# Patient Record
Sex: Male | Born: 1940 | Race: White | Hispanic: No | Marital: Married
Health system: Southern US, Academic
[De-identification: ages and names within clinical notes are randomized; demographics above are authoritative.]

## PROBLEM LIST (undated history)

## (undated) ENCOUNTER — Encounter

## (undated) ENCOUNTER — Telehealth

## (undated) ENCOUNTER — Ambulatory Visit: Payer: MEDICARE

## (undated) ENCOUNTER — Encounter
Attending: Student in an Organized Health Care Education/Training Program | Primary: Student in an Organized Health Care Education/Training Program

## (undated) ENCOUNTER — Ambulatory Visit: Payer: Medicare (Managed Care)

## (undated) ENCOUNTER — Encounter: Attending: Dermatology | Primary: Dermatology

## (undated) ENCOUNTER — Ambulatory Visit

## (undated) ENCOUNTER — Telehealth: Attending: Dermatology | Primary: Dermatology

## (undated) DIAGNOSIS — L111 Transient acantholytic dermatosis [Grover]: Secondary | ICD-10-CM

## (undated) DIAGNOSIS — E785 Hyperlipidemia, unspecified: Secondary | ICD-10-CM

## (undated) DIAGNOSIS — C801 Malignant (primary) neoplasm, unspecified: Secondary | ICD-10-CM

## (undated) DIAGNOSIS — K2270 Barrett's esophagus without dysplasia: Secondary | ICD-10-CM

## (undated) DIAGNOSIS — I10 Essential (primary) hypertension: Secondary | ICD-10-CM

## (undated) DIAGNOSIS — K219 Gastro-esophageal reflux disease without esophagitis: Secondary | ICD-10-CM

## (undated) DIAGNOSIS — I059 Rheumatic mitral valve disease, unspecified: Secondary | ICD-10-CM

## (undated) DIAGNOSIS — K635 Polyp of colon: Secondary | ICD-10-CM

## (undated) HISTORY — PX: SKIN CANCER EXCISION: SHX779

## (undated) HISTORY — DX: Barrett's esophagus without dysplasia: K22.70

## (undated) HISTORY — DX: Gastro-esophageal reflux disease without esophagitis: K21.9

## (undated) HISTORY — DX: Polyp of colon: K63.5

---

## 2006-12-06 ENCOUNTER — Ambulatory Visit: Payer: Self-pay | Admitting: Ophthalmology

## 2006-12-20 ENCOUNTER — Ambulatory Visit: Payer: Self-pay | Admitting: Ophthalmology

## 2007-06-12 ENCOUNTER — Ambulatory Visit: Payer: Self-pay | Admitting: Family Medicine

## 2009-10-04 ENCOUNTER — Ambulatory Visit: Payer: Self-pay | Admitting: Family Medicine

## 2010-07-02 ENCOUNTER — Ambulatory Visit: Payer: Self-pay | Admitting: Ophthalmology

## 2010-07-23 ENCOUNTER — Ambulatory Visit: Payer: Self-pay | Admitting: Ophthalmology

## 2011-09-19 ENCOUNTER — Ambulatory Visit: Payer: Self-pay | Admitting: Family Medicine

## 2012-08-12 ENCOUNTER — Ambulatory Visit: Payer: Self-pay | Admitting: Emergency Medicine

## 2012-08-12 LAB — URINALYSIS, COMPLETE
Glucose,UR: NEGATIVE mg/dL (ref 0–75)
Ketone: NEGATIVE
Nitrite: NEGATIVE
Ph: 6 (ref 4.5–8.0)
Protein: NEGATIVE

## 2012-08-13 LAB — URINE CULTURE

## 2014-12-07 ENCOUNTER — Other Ambulatory Visit: Payer: Self-pay | Admitting: Cardiology

## 2014-12-07 DIAGNOSIS — R1011 Right upper quadrant pain: Secondary | ICD-10-CM

## 2014-12-11 ENCOUNTER — Ambulatory Visit: Admission: RE | Admit: 2014-12-11 | Payer: Medicare PPO | Source: Ambulatory Visit

## 2014-12-11 ENCOUNTER — Ambulatory Visit
Admission: RE | Admit: 2014-12-11 | Discharge: 2014-12-11 | Disposition: A | Discharge status: Home / Self Care | Payer: Medicare PPO | Source: Ambulatory Visit | Attending: Cardiology | Admitting: Cardiology

## 2014-12-11 ENCOUNTER — Ambulatory Visit: Payer: Medicare PPO

## 2014-12-11 DIAGNOSIS — R1011 Right upper quadrant pain: Secondary | ICD-10-CM | POA: Insufficient documentation

## 2014-12-11 DIAGNOSIS — R110 Nausea: Secondary | ICD-10-CM | POA: Diagnosis not present

## 2015-04-23 DIAGNOSIS — K635 Polyp of colon: Secondary | ICD-10-CM

## 2015-04-23 HISTORY — DX: Polyp of colon: K63.5

## 2015-05-09 ENCOUNTER — Encounter: Payer: Self-pay | Admitting: *Deleted

## 2015-05-10 ENCOUNTER — Ambulatory Visit: Payer: Medicare Other | Admitting: Anesthesiology

## 2015-05-10 ENCOUNTER — Encounter: Payer: Self-pay | Admitting: *Deleted

## 2015-05-10 ENCOUNTER — Encounter
Admission: RE | Disposition: A | Discharge status: Home / Self Care | Payer: Self-pay | Source: Ambulatory Visit | Attending: Gastroenterology

## 2015-05-10 ENCOUNTER — Ambulatory Visit
Admission: RE | Admit: 2015-05-10 | Discharge: 2015-05-10 | Disposition: A | Discharge status: Home / Self Care | Payer: Medicare Other | Source: Ambulatory Visit | Attending: Gastroenterology | Admitting: Gastroenterology

## 2015-05-10 DIAGNOSIS — E785 Hyperlipidemia, unspecified: Secondary | ICD-10-CM | POA: Diagnosis not present

## 2015-05-10 DIAGNOSIS — R1013 Epigastric pain: Secondary | ICD-10-CM | POA: Diagnosis present

## 2015-05-10 DIAGNOSIS — I341 Nonrheumatic mitral (valve) prolapse: Secondary | ICD-10-CM | POA: Insufficient documentation

## 2015-05-10 DIAGNOSIS — Z85828 Personal history of other malignant neoplasm of skin: Secondary | ICD-10-CM | POA: Insufficient documentation

## 2015-05-10 DIAGNOSIS — D131 Benign neoplasm of stomach: Secondary | ICD-10-CM | POA: Diagnosis not present

## 2015-05-10 DIAGNOSIS — Z882 Allergy status to sulfonamides status: Secondary | ICD-10-CM | POA: Insufficient documentation

## 2015-05-10 DIAGNOSIS — D127 Benign neoplasm of rectosigmoid junction: Secondary | ICD-10-CM | POA: Diagnosis not present

## 2015-05-10 DIAGNOSIS — I10 Essential (primary) hypertension: Secondary | ICD-10-CM | POA: Insufficient documentation

## 2015-05-10 HISTORY — DX: Essential (primary) hypertension: I10

## 2015-05-10 HISTORY — DX: Rheumatic mitral valve disease, unspecified: I05.9

## 2015-05-10 HISTORY — PX: ESOPHAGOGASTRODUODENOSCOPY (EGD) WITH PROPOFOL: SHX5813

## 2015-05-10 HISTORY — DX: Malignant (primary) neoplasm, unspecified: C80.1

## 2015-05-10 HISTORY — DX: Hyperlipidemia, unspecified: E78.5

## 2015-05-10 HISTORY — PX: COLONOSCOPY WITH PROPOFOL: SHX5780

## 2015-05-10 SURGERY — COLONOSCOPY WITH PROPOFOL
Anesthesia: General

## 2015-05-10 MED ORDER — LIDOCAINE HCL (CARDIAC) 20 MG/ML IV SOLN
INTRAVENOUS | Status: DC | PRN
Start: 2015-05-10 — End: 2015-05-10
  Administered 2015-05-10: 80 mg via INTRAVENOUS

## 2015-05-10 MED ORDER — SODIUM CHLORIDE 0.9 % IV SOLN
INTRAVENOUS | Status: DC
Start: 2015-05-10 — End: 2015-05-10

## 2015-05-10 MED ORDER — MIDAZOLAM HCL 2 MG/2ML IJ SOLN
INTRAMUSCULAR | Status: DC | PRN
  Administered 2015-05-10: 2 mg via INTRAVENOUS

## 2015-05-10 MED ORDER — PROPOFOL 500 MG/50ML IV EMUL
INTRAVENOUS | Status: DC | PRN
Start: 2015-05-10 — End: 2015-05-10
  Administered 2015-05-10: 150 ug/kg/min via INTRAVENOUS

## 2015-05-10 MED ORDER — SODIUM CHLORIDE 0.9 % IV SOLN
INTRAVENOUS | Status: DC
  Administered 2015-05-10: 10:00:00 via INTRAVENOUS

## 2015-05-10 NOTE — Op Note (Signed)
[facility] Patient Name: Steve Rhodes Procedure Date: 05/10/2015 11:06 AM MRN: [id number] Account #: [id number] Date of Birth: Mar 30, 1941 Admit Type: Outpatient Age: 74 Room: [facility] ENDO ROOM 4 Gender: Male Note Status: Finalized Procedure:         Upper GI endoscopy Indications:       Epigastric abdominal pain Providers:         Lupita Dawn. Candace Cruise, MD Referring MD:      Amado Nash, MD (Referring MD) Medicines:         Monitored Anesthesia Care Complications:     No immediate complications. Procedure:         Pre-Anesthesia Assessment:                    - Prior to the procedure, a History and Physical was                     performed, and patient medications, allergies and                     sensitivities were reviewed. The patient's tolerance of                     previous anesthesia was reviewed.                    - The risks and benefits of the procedure and the sedation                     options and risks were discussed with the patient. All                     questions were answered and informed consent was obtained.                    - After reviewing the risks and benefits, the patient was                     deemed in satisfactory condition to undergo the procedure.                    After obtaining informed consent, the endoscope was passed                     under direct vision. Throughout the procedure, the                     patient's blood pressure, pulse, and oxygen saturations                     were monitored continuously. The Endoscope was introduced                     through the mouth, and advanced to the second part of                     duodenum. The upper GI endoscopy was accomplished without                     difficulty. The patient tolerated the procedure well. Findings:      A single polyp was found at the gastroesophageal junction. Biopsies were       taken with a cold forceps for histology.      The exam  was otherwise  without abnormality.      The entire examined stomach was normal.      The examined duodenum was normal. 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 Biopsied.                    - The examination was otherwise normal.                    - Normal stomach.                    - Normal examined duodenum. Recommendation:    - Discharge patient to home.                    - Observe patient's clinical course.                    - Await pathology results.                    - The findings and recommendations were discussed with the                     patient. Procedure Code(s): --- Professional ---                    [phone], Esophagogastroduodenoscopy, flexible, transoral;                     with biopsy, single or multiple Diagnosis Code(s): --- Professional ---                    K31.7, Polyp of stomach and duodenum                    R10.13, Epigastric pain CPT copyright 2014 American Medical Association. All rights reserved. The codes documented in this report are preliminary and upon coder review may  be revised to meet current compliance requirements. Hulen Luster, MD 05/10/2015 11:18:48 AM This report has been signed electronically. Number of Addenda: 0 Note Initiated On: 05/10/2015 11:06 AM      [facility]

## 2015-05-10 NOTE — Anesthesia Postprocedure Evaluation (Signed)
  Anesthesia Post-op Note  Patient: Steve Rhodes.  Procedure(s) Performed: Procedure(s): COLONOSCOPY WITH PROPOFOL (N/A) ESOPHAGOGASTRODUODENOSCOPY (EGD) WITH PROPOFOL (N/A)  Anesthesia type:General  Patient location: PACU  Post pain: Pain level controlled  Post assessment: Post-op Vital signs reviewed, Patient's Cardiovascular Status Stable, Respiratory Function Stable, Patent Airway and No signs of Nausea or vomiting  Post vital signs: Reviewed and stable  Last Vitals:  Filed Vitals:   05/10/15 1211  BP: 127/70  Pulse: 58  Temp:   Resp: 22    Level of consciousness: awake, alert  and patient cooperative  Complications: No apparent anesthesia complications

## 2015-05-10 NOTE — H&P (Signed)
  Date of Initial H&P: 04/11/2015  History reviewed, patient examined, no change in status, stable for surgery.

## 2015-05-10 NOTE — Anesthesia Preprocedure Evaluation (Addendum)
Anesthesia Evaluation  Patient identified by MRN, date of birth, ID band Patient awake    Reviewed: Allergy & Precautions, H&P , NPO status , Patient's Chart, lab work & pertinent test results  History of Anesthesia Complications Negative for: history of anesthetic complications  Airway Mallampati: III  TM Distance: >3 FB Neck ROM: limited    Dental  (+) Poor Dentition, Chipped   Pulmonary neg pulmonary ROS, neg shortness of breath,    Pulmonary exam normal breath sounds clear to auscultation       Cardiovascular Exercise Tolerance: Good hypertension, (-) Past MI Normal cardiovascular exam+ Valvular Problems/Murmurs (MR)  Rhythm:regular Rate:Normal     Neuro/Psych negative neurological ROS  negative psych ROS   GI/Hepatic negative GI ROS, Neg liver ROS,   Endo/Other  negative endocrine ROS  Renal/GU negative Renal ROS  negative genitourinary   Musculoskeletal   Abdominal   Peds  Hematology negative hematology ROS (+)   Anesthesia Other Findings Past Medical History:   Hyperlipidemia                                               Hypertension                                                 Cancer ([place])                                                   Comment:skin cancer   Mitral valve disorder                                       Past Surgical History:   SKIN CANCER EXCISION                                            Comment:on arms  BMI    Body Mass Index   32.99 kg/m 2    Patient reports cardiac clearance from this procedure    Reproductive/Obstetrics negative OB ROS                            Anesthesia Physical Anesthesia Plan  ASA: III  Anesthesia Plan: General   Post-op Pain Management:    Induction:   Airway Management Planned:   Additional Equipment:   Intra-op Plan:   Post-operative Plan:   Informed Consent: I have reviewed the patients History and  Physical, chart, labs and discussed the procedure including the risks, benefits and alternatives for the proposed anesthesia with the patient or authorized representative who has indicated his/her understanding and acceptance.   Dental Advisory Given  Plan Discussed with: Anesthesiologist, CRNA and Surgeon  Anesthesia Plan Comments:         Anesthesia Quick Evaluation

## 2015-05-10 NOTE — Op Note (Signed)
[facility] Patient Name: Steve Rhodes Procedure Date: 05/10/2015 11:05 AM MRN: [id number] Account #: [id number] Date of Birth: February 28, 1941 Admit Type: Outpatient Age: 74 Room: [facility] ENDO ROOM 4 Gender: Male Note Status: Finalized Procedure:         Colonoscopy Indications:       Change in bowel habits Providers:         Lupita Dawn. Candace Cruise, MD Referring MD:      Amado Nash, MD (Referring MD) Medicines:         Monitored Anesthesia Care Complications:     No immediate complications. Procedure:         Pre-Anesthesia Assessment:                    - Prior to the procedure, a History and Physical was                     performed, and patient medications, allergies and                     sensitivities were reviewed. The patient's tolerance of                     previous anesthesia was reviewed.                    - The risks and benefits of the procedure and the sedation                     options and risks were discussed with the patient. All                     questions were answered and informed consent was obtained.                    - After reviewing the risks and benefits, the patient was                     deemed in satisfactory condition to undergo the procedure.                    After obtaining informed consent, the colonoscope was                     passed under direct vision. Throughout the procedure, the                     patient's blood pressure, pulse, and oxygen saturations                     were monitored continuously. The Colonoscope was                     introduced through the anus and advanced to the the cecum,                     identified by appendiceal orifice and ileocecal valve. The                     colonoscopy was performed without difficulty. The patient                     tolerated the procedure well. The quality of the bowel  preparation was fair. Findings:      A diminutive polyp was  found in the recto-sigmoid colon. The polyp was       sessile. The polyp was removed with a jumbo cold forceps. Resection and       retrieval were complete.      The exam was otherwise without abnormality. Impression:        - One diminutive polyp at the recto-sigmoid colon.                     Resected and retrieved.                    - The examination was otherwise normal. Recommendation:    - Discharge patient to home.                    - Await pathology results.                    - Repeat colonoscopy in 5-10 years for surveillance based                     on pathology results.                    - The findings and recommendations were discussed with the                     patient. Procedure Code(s): --- Professional ---                    [phone], Colonoscopy, flexible; with biopsy, single or                     multiple Diagnosis Code(s): --- Professional ---                    D12.7, Benign neoplasm of rectosigmoid junction                    R19.4, Change in bowel habit CPT copyright 2014 American Medical Association. All rights reserved. The codes documented in this report are preliminary and upon coder review may  be revised to meet current compliance requirements. Hulen Luster, MD 05/10/2015 11:35:51 AM This report has been signed electronically. Number of Addenda: 0 Note Initiated On: 05/10/2015 11:05 AM Scope Withdrawal Time: 0 hours 8 minutes 29 seconds  Total Procedure Duration: 0 hours 10 minutes 42 seconds       [facility]

## 2015-05-10 NOTE — Transfer of Care (Signed)
Immediate Anesthesia Transfer of Care Note  Patient: Steve Rhodes.  Procedure(s) Performed: Procedure(s): COLONOSCOPY WITH PROPOFOL (N/A) ESOPHAGOGASTRODUODENOSCOPY (EGD) WITH PROPOFOL (N/A)  Patient Location: PACU and Endoscopy Unit  Anesthesia Type:General  Level of Consciousness: sedated  Airway & Oxygen Therapy: Patient Spontanous Breathing and Patient connected to nasal cannula oxygen  Post-op Assessment: Report given to RN and Post -op Vital signs reviewed and stable  Post vital signs: Reviewed and stable  Last Vitals: 101/70 bp 18 resp 95% sat 66 hr at 11:30  Filed Vitals:   05/10/15 1130  BP:   Pulse:   Temp: 36.1 C  Resp:     Complications: No apparent anesthesia complications

## 2015-05-13 LAB — SURGICAL PATHOLOGY

## 2015-05-23 DIAGNOSIS — K2270 Barrett's esophagus without dysplasia: Secondary | ICD-10-CM

## 2015-05-23 HISTORY — DX: Barrett's esophagus without dysplasia: K22.70

## 2015-06-10 ENCOUNTER — Ambulatory Visit: Payer: Medicare Other | Admitting: Anesthesiology

## 2015-06-10 ENCOUNTER — Encounter
Admission: RE | Disposition: A | Discharge status: Home / Self Care | Payer: Self-pay | Source: Ambulatory Visit | Attending: Gastroenterology

## 2015-06-10 ENCOUNTER — Ambulatory Visit
Admission: RE | Admit: 2015-06-10 | Discharge: 2015-06-10 | Disposition: A | Discharge status: Home / Self Care | Payer: Medicare Other | Source: Ambulatory Visit | Attending: Gastroenterology | Admitting: Gastroenterology

## 2015-06-10 ENCOUNTER — Encounter: Payer: Self-pay | Admitting: Anesthesiology

## 2015-06-10 DIAGNOSIS — Z79899 Other long term (current) drug therapy: Secondary | ICD-10-CM | POA: Insufficient documentation

## 2015-06-10 DIAGNOSIS — I10 Essential (primary) hypertension: Secondary | ICD-10-CM | POA: Diagnosis not present

## 2015-06-10 DIAGNOSIS — Z8719 Personal history of other diseases of the digestive system: Secondary | ICD-10-CM | POA: Diagnosis not present

## 2015-06-10 DIAGNOSIS — Z85828 Personal history of other malignant neoplasm of skin: Secondary | ICD-10-CM | POA: Insufficient documentation

## 2015-06-10 DIAGNOSIS — Z85820 Personal history of malignant melanoma of skin: Secondary | ICD-10-CM | POA: Diagnosis not present

## 2015-06-10 DIAGNOSIS — R0789 Other chest pain: Secondary | ICD-10-CM | POA: Insufficient documentation

## 2015-06-10 DIAGNOSIS — K219 Gastro-esophageal reflux disease without esophagitis: Secondary | ICD-10-CM | POA: Insufficient documentation

## 2015-06-10 DIAGNOSIS — K2270 Barrett's esophagus without dysplasia: Secondary | ICD-10-CM | POA: Insufficient documentation

## 2015-06-10 DIAGNOSIS — E785 Hyperlipidemia, unspecified: Secondary | ICD-10-CM | POA: Diagnosis not present

## 2015-06-10 HISTORY — PX: ESOPHAGOGASTRODUODENOSCOPY (EGD) WITH PROPOFOL: SHX5813

## 2015-06-10 SURGERY — ESOPHAGOGASTRODUODENOSCOPY (EGD) WITH PROPOFOL
Anesthesia: General

## 2015-06-10 MED ORDER — PHENYLEPHRINE HCL 10 MG/ML IJ SOLN
INTRAMUSCULAR | Status: DC | PRN
  Administered 2015-06-10 (×2): 100 ug via INTRAVENOUS

## 2015-06-10 MED ORDER — PROPOFOL 10 MG/ML IV BOLUS
INTRAVENOUS | Status: DC | PRN
  Administered 2015-06-10: 20 mg via INTRAVENOUS
  Administered 2015-06-10: 30 mg via INTRAVENOUS
  Administered 2015-06-10 (×2): 20 mg via INTRAVENOUS

## 2015-06-10 MED ORDER — SODIUM CHLORIDE 0.9 % IV SOLN: INTRAVENOUS | Status: DC

## 2015-06-10 MED ORDER — SODIUM CHLORIDE 0.9 % IV SOLN
INTRAVENOUS | Status: DC
  Administered 2015-06-10: 1000 mL via INTRAVENOUS

## 2015-06-10 NOTE — Op Note (Signed)
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 Candace Cruise, MD Referring MD:      Marcellina Millin, MD (Referring MD) Medicines:         Monitored Anesthesia Care Complications:     No immediate complications. Procedure:         Pre-Anesthesia Assessment:                    - Prior to the procedure, a History and Physical was                     performed, and patient medications, allergies and                     sensitivities were reviewed. The patient's tolerance of                     previous anesthesia was reviewed.                    - The risks and benefits of the procedure and the sedation                     options and risks were discussed with the patient. All                     questions were answered and informed consent was obtained.                    - After reviewing the risks and benefits, the patient was                     deemed in satisfactory condition to undergo the procedure.                    After obtaining informed consent, the endoscope was passed                     under direct vision. Throughout the procedure, the                     patient's blood pressure, pulse, and oxygen saturations                     were monitored continuously. The Endoscope was introduced                     through the mouth, and advanced to the second part of                     duodenum. The upper GI endoscopy was accomplished without                     difficulty. The patient tolerated the procedure well. Findings:      There were esophageal mucosal changes suspicious for short-segment       Barrett's esophagus present at the  gastroesophageal junction. Mucosa was  biopsied with a cold forceps for histology in a targeted manner at the       gastroesophageal junction. One specimen bottle was sent to pathology.       Nop obvious polyp seen this time. Nevertheless, suspicious area was       removed with polypectomy with hot snare.      The exam was otherwise without abnormality.      The entire examined stomach was normal.      The examined duodenum was normal. Impression:        - Esophageal mucosal changes suspicious for short-segment                     Barrett's esophagus. Biopsied.                    - The examination was otherwise normal.                    - Normal stomach.                    - Normal examined duodenum. Recommendation:    - Discharge patient to home.                    - Observe patient's clinical course.                    - Await pathology results.                    - The findings and recommendations were discussed with the                     patient. Procedure Code(s): --- Professional ---                    [id number], Esophagogastroduodenoscopy, flexible, transoral;                     with biopsy, single or multiple Diagnosis Code(s): --- Professional ---                    K22.8, Other specified diseases of esophagus CPT copyright 2014 American Medical Association. All rights reserved. The codes documented in this report are preliminary and upon coder review may  be revised to meet current compliance requirements. Hulen Luster, MD 06/10/2015 11:14:28 AM This report has been signed electronically. Number of Addenda: 0 Note Initiated On: 06/10/2015 10:53 AM      [facility]

## 2015-06-10 NOTE — H&P (Signed)
  Date of Initial H&P: 05/30/2015  History reviewed, patient examined, no change in status, stable for surgery. 

## 2015-06-10 NOTE — Anesthesia Preprocedure Evaluation (Addendum)
Anesthesia Evaluation  Patient identified by MRN, date of birth, ID band Patient awake    Reviewed: Allergy & Precautions, H&P , NPO status , Patient's Chart, lab work & pertinent test results  History of Anesthesia Complications Negative for: history of anesthetic complications  Airway Mallampati: III  TM Distance: >3 FB Neck ROM: limited    Dental  (+) Poor Dentition, Chipped   Pulmonary neg pulmonary ROS, neg shortness of breath,    Pulmonary exam normal breath sounds clear to auscultation       Cardiovascular Exercise Tolerance: Good hypertension, (-) Past MI Normal cardiovascular exam+ Valvular Problems/Murmurs (MR) MR  Rhythm:regular Rate:Normal     Neuro/Psych negative neurological ROS  negative psych ROS   GI/Hepatic negative GI ROS, Neg liver ROS,   Endo/Other  negative endocrine ROS  Renal/GU negative Renal ROS  negative genitourinary   Musculoskeletal   Abdominal   Peds  Hematology negative hematology ROS (+)   Anesthesia Other Findings Past Medical History:   Hyperlipidemia                                               Hypertension                                                 Cancer ([place])                                                   Comment:skin cancer   Mitral valve disorder                                       Past Surgical History:   SKIN CANCER EXCISION                                            Comment:on arms  BMI    Body Mass Index   32.99 kg/m 2    Patient has cardiac clearance for this procedure.     Reproductive/Obstetrics negative OB ROS                            Anesthesia Physical  Anesthesia Plan  ASA: III  Anesthesia Plan: General   Post-op Pain Management:    Induction:   Airway Management Planned:   Additional Equipment:   Intra-op Plan:   Post-operative Plan:   Informed Consent: I have reviewed the patients History and  Physical, chart, labs and discussed the procedure including the risks, benefits and alternatives for the proposed anesthesia with the patient or authorized representative who has indicated his/her understanding and acceptance.   Dental Advisory Given  Plan Discussed with: Anesthesiologist, CRNA and Surgeon  Anesthesia Plan Comments:         Anesthesia Quick Evaluation

## 2015-06-10 NOTE — Anesthesia Postprocedure Evaluation (Signed)
Anesthesia Post Note  Patient: Ramond Dial.  Procedure(s) Performed: Procedure(s) (LRB): ESOPHAGOGASTRODUODENOSCOPY (EGD) WITH PROPOFOL (N/A)  Patient location during evaluation: Endoscopy Anesthesia Type: General Level of consciousness: awake and alert Pain management: pain level controlled Vital Signs Assessment: post-procedure vital signs reviewed and stable Respiratory status: spontaneous breathing, nonlabored ventilation, respiratory function stable and patient connected to nasal cannula oxygen Cardiovascular status: blood pressure returned to baseline and stable Postop Assessment: no signs of nausea or vomiting Anesthetic complications: no    Last Vitals:  Filed Vitals:   06/10/15 1140 06/10/15 1150  BP: 116/67 127/67  Pulse: 58 59  Temp:    Resp: 19 12    Last Pain: There were no vitals filed for this visit.               Precious Haws [place]

## 2015-06-10 NOTE — Transfer of Care (Signed)
Immediate Anesthesia Transfer of Care Note  Patient: Steve Rhodes.  Procedure(s) Performed: Procedure(s): ESOPHAGOGASTRODUODENOSCOPY (EGD) WITH PROPOFOL (N/A)  Patient Location: Endoscopy Unit  Anesthesia Type:General  Level of Consciousness: awake and alert   Airway & Oxygen Therapy: Patient Spontanous Breathing and Patient connected to nasal cannula oxygen  Post-op Assessment: Report given to RN and Post -op Vital signs reviewed and stable  Post vital signs: Reviewed  Last Vitals:  Filed Vitals:   06/10/15 1118 06/10/15 1130  BP: 90/52 102/56  Pulse: 60 59  Temp: 35.8 C   Resp: 20 19    Complications: No apparent anesthesia complications

## 2015-06-11 LAB — SURGICAL PATHOLOGY

## 2015-06-13 ENCOUNTER — Encounter: Payer: Self-pay | Admitting: Gastroenterology

## 2016-01-28 ENCOUNTER — Ambulatory Visit
Admission: RE | Admit: 2016-01-28 | Discharge: 2016-01-28 | Disposition: A | Discharge status: Home / Self Care | Payer: Medicare Other | Source: Ambulatory Visit | Attending: Cardiology | Admitting: Cardiology

## 2016-01-28 ENCOUNTER — Encounter
Admission: RE | Disposition: A | Discharge status: Home / Self Care | Payer: Self-pay | Source: Ambulatory Visit | Attending: Cardiology

## 2016-01-28 ENCOUNTER — Other Ambulatory Visit: Payer: Self-pay | Admitting: Cardiology

## 2016-01-28 DIAGNOSIS — Z882 Allergy status to sulfonamides status: Secondary | ICD-10-CM | POA: Diagnosis not present

## 2016-01-28 DIAGNOSIS — E782 Mixed hyperlipidemia: Secondary | ICD-10-CM | POA: Insufficient documentation

## 2016-01-28 DIAGNOSIS — Z9889 Other specified postprocedural states: Secondary | ICD-10-CM | POA: Insufficient documentation

## 2016-01-28 DIAGNOSIS — Z8719 Personal history of other diseases of the digestive system: Secondary | ICD-10-CM | POA: Insufficient documentation

## 2016-01-28 DIAGNOSIS — K2270 Barrett's esophagus without dysplasia: Secondary | ICD-10-CM | POA: Diagnosis not present

## 2016-01-28 DIAGNOSIS — Z79899 Other long term (current) drug therapy: Secondary | ICD-10-CM | POA: Diagnosis not present

## 2016-01-28 DIAGNOSIS — Z86010 Personal history of colonic polyps: Secondary | ICD-10-CM | POA: Diagnosis not present

## 2016-01-28 DIAGNOSIS — R0789 Other chest pain: Secondary | ICD-10-CM | POA: Insufficient documentation

## 2016-01-28 DIAGNOSIS — Z91040 Latex allergy status: Secondary | ICD-10-CM | POA: Insufficient documentation

## 2016-01-28 DIAGNOSIS — Z85828 Personal history of other malignant neoplasm of skin: Secondary | ICD-10-CM | POA: Diagnosis not present

## 2016-01-28 DIAGNOSIS — I10 Essential (primary) hypertension: Secondary | ICD-10-CM | POA: Diagnosis not present

## 2016-01-28 DIAGNOSIS — Z8249 Family history of ischemic heart disease and other diseases of the circulatory system: Secondary | ICD-10-CM | POA: Diagnosis not present

## 2016-01-28 DIAGNOSIS — R9430 Abnormal result of cardiovascular function study, unspecified: Secondary | ICD-10-CM | POA: Diagnosis present

## 2016-01-28 HISTORY — PX: CARDIAC CATHETERIZATION: SHX172

## 2016-01-28 SURGERY — LEFT HEART CATH AND CORONARY ANGIOGRAPHY
Anesthesia: Moderate Sedation | Laterality: Left

## 2016-01-28 MED ORDER — SODIUM CHLORIDE 0.9 % WEIGHT BASED INFUSION: 1.0000 mL/kg/h | INTRAVENOUS | Status: DC

## 2016-01-28 MED ORDER — ASPIRIN 81 MG PO CHEW: 81.0000 mg | CHEWABLE_TABLET | ORAL | Status: DC

## 2016-01-28 MED ORDER — MIDAZOLAM HCL 2 MG/2ML IJ SOLN
INTRAMUSCULAR | Status: DC | PRN
  Administered 2016-01-28: 1 mg via INTRAVENOUS

## 2016-01-28 MED ORDER — FENTANYL CITRATE (PF) 100 MCG/2ML IJ SOLN
INTRAMUSCULAR | Status: AC
  Filled 2016-01-28: qty 2

## 2016-01-28 MED ORDER — SODIUM CHLORIDE 0.9% FLUSH: 3.0000 mL | INTRAVENOUS | Status: DC | PRN

## 2016-01-28 MED ORDER — SODIUM CHLORIDE 0.9 % WEIGHT BASED INFUSION: 3.0000 mL/kg/h | INTRAVENOUS | Status: DC

## 2016-01-28 MED ORDER — IOPAMIDOL (ISOVUE-300) INJECTION 61%
INTRAVENOUS | Status: DC | PRN
  Administered 2016-01-28: 90 mL via INTRA_ARTERIAL

## 2016-01-28 MED ORDER — MIDAZOLAM HCL 2 MG/2ML IJ SOLN
INTRAMUSCULAR | Status: AC
  Filled 2016-01-28: qty 2

## 2016-01-28 MED ORDER — SODIUM CHLORIDE 0.9 % IV SOLN: 250.0000 mL | INTRAVENOUS | Status: DC | PRN

## 2016-01-28 MED ORDER — SODIUM CHLORIDE 0.9% FLUSH: 3.0000 mL | Freq: Two times a day (BID) | INTRAVENOUS | Status: DC

## 2016-01-28 MED ORDER — HEPARIN (PORCINE) IN NACL 2-0.9 UNIT/ML-% IJ SOLN
INTRAMUSCULAR | Status: AC
  Filled 2016-01-28: qty 500

## 2016-01-28 MED ORDER — FENTANYL CITRATE (PF) 100 MCG/2ML IJ SOLN
INTRAMUSCULAR | Status: DC | PRN
  Administered 2016-01-28: 25 ug via INTRAVENOUS

## 2016-01-28 SURGICAL SUPPLY — 9 items
CATH INFINITI 5FR ANG PIGTAIL (CATHETERS) ×3 IMPLANT
CATH INFINITI 5FR JL4 (CATHETERS) ×3 IMPLANT
CATH INFINITI 5FR JL5 (CATHETERS) ×3 IMPLANT
CATH INFINITI JR4 5F (CATHETERS) ×3 IMPLANT
KIT MANI 3VAL PERCEP (MISCELLANEOUS) ×3 IMPLANT
NEEDLE PERC 18GX7CM (NEEDLE) ×3 IMPLANT
PACK CARDIAC CATH (CUSTOM PROCEDURE TRAY) ×3 IMPLANT
SHEATH AVANTI 5FR X 11CM (SHEATH) ×3 IMPLANT
WIRE EMERALD 3MM-J .035X150CM (WIRE) ×3 IMPLANT

## 2016-01-28 NOTE — H&P (Signed)
Chief Complaint: Chief Complaint  Patient presents with  . 2 week follow up  myoview and echo  Date of Service: 01/21/2016 Date of Birth: [birth year] PCP: Rich Number, MD  History of Present Illness: Steve Rhodes is a 75 y.o.male patient who presents in follow-up. Returns with complaints of chest pain. He had some similar chest pain one year ago. He underwent a sestamibi stress test showing normal LV function and no ischemia. He has noticed exertional chest tightness and shortness of breath which is worsened over the last 6 months to a year. He denies rest symptoms. He was evaluated with an echocardiogram and a functional study. Echocardiogram showed preserved LV function. Functional study showed borderline inferior ischemia. He continues to have exertional symptoms. These do not occur at rest. Past Medical and Surgical History  Past Medical History Past Medical History:  Diagnosis Date  . Adenomatous polyp of stomach 05/10/2015  . Barrett's esophagus determined by biopsy 06/10/2015  . Cancer (CMS-HCC)  . Colon polyp  . Hyperlipidemia  . Hypertension  . Skin cancer  face   Past Surgical History He has a past surgical history that includes skin cancer; Colonoscopy (05/10/2015); Skin cancer surgery; egd (05/10/2015); and egd (06/10/2015).   Medications and Allergies  Current Medications  Current Outpatient Prescriptions  Medication Sig Dispense Refill  . ADRUCIL 500 mg/10 mL chemo injection bring bottle with you to dermatologist office 3  . atorvastatin (LIPITOR) 10 MG tablet Take 10 mg by mouth once daily.  . fluorouracil (ADRUCIL) 500 mg/10 mL chemo injection 50mg /ml - bring with you to dermatologist office  . lisinopril-hydrochlorothiazide (PRINZIDE,ZESTORETIC) 20-25 mg tablet Take 1 tablet by mouth once daily.  . pantoprazole (PROTONIX) 40 MG DR tablet Take 1 tablet (40 mg total) by mouth 2 (two) times daily. 60 tablet 11  . tamsulosin (FLOMAX) 0.4 mg capsule  .  triamcinolone 0.1 % cream  . valACYclovir (VALTREX) 1000 MG tablet   No current facility-administered medications for this visit.   Allergies: Latex and Sulfa (sulfonamide antibiotics)  Social and Family History  Social History reports that he has never smoked. He does not have any smokeless tobacco history on file. He reports that he does not drink alcohol or use illicit drugs.  Family History Family History  Problem Relation Age of Onset  . Heart attack Mother  . Skin cancer Mother   Review of Systems  Review of Systems  Constitutional: Negative for chills, diaphoresis, fever, malaise/fatigue and weight loss.  HENT: Negative for congestion, ear discharge, hearing loss and tinnitus.  Eyes: Negative for blurred vision.  Respiratory: Positive for shortness of breath and wheezing. Negative for cough, hemoptysis and sputum production.  Cardiovascular: Positive for chest pain. Negative for palpitations, orthopnea, claudication, leg swelling and PND.  Gastrointestinal: Negative for abdominal pain, blood in stool, constipation, melena, nausea and vomiting.  Genitourinary: Negative for dysuria, frequency, hematuria and urgency.  Musculoskeletal: Negative for back pain, falls, joint pain and myalgias.  Skin: Negative for itching and rash.  Neurological: Negative for dizziness, tingling, focal weakness, loss of consciousness, weakness and headaches.  Endo/Heme/Allergies: Negative for polydipsia. Does not bruise/bleed easily.  Psychiatric/Behavioral: Negative for depression, memory loss and substance abuse. The patient is not nervous/anxious.    Physical Examination   Vitals:BP 140/80  Pulse 64  Resp 12  Ht 185.4 cm (6\' 1" )  Wt (!) 114.8 kg (253 lb)  BMI 33.38 kg/m2 Ht:185.4 cm (6\' 1" ) Wt:(!) 114.8 kg (253 lb) [id number] surface area is 2.43 meters  squared. Body mass index is 33.38 kg/(m^2).  Wt Readings from Last 3 Encounters:  01/21/16 (!) 114.8 kg (253 lb)  01/03/16 (!) 115.4 kg  (254 lb 6.4 oz)  08/13/15 (!) 119.8 kg (264 lb 3.2 oz)   BP Readings from Last 3 Encounters:  01/21/16 140/80  01/03/16 122/62  08/13/15 118/78   General appearance appears in no acute distress  Head Mouth and Eye exam Normocephalic, without obvious abnormality, atraumatic Dentition is good Eyes appear anicteric   Neck exam Thyroid: normal  Nodes: no obvious adenopathy  LUNGS Breath Sounds: Normal Percussion: Normal  CARDIOVASCULAR JVP CV wave: no HJR: no Elevation at 90 degrees: None Carotid Pulse: normal pulsation bilaterally Bruit: None Apex: apical impulse normal  Auscultation Rhythm: normal sinus rhythm S1: normal S2: normal Clicks: no Rub: no Murmurs: no murmurs  Gallop: None ABDOMEN Liver enlargement: no Pulsatile aorta: no Ascites: no Bruits: no  EXTREMITIES Clubbing: no Edema: trace to 1+ bilateral pedal edema Pulses: peripheral pulses symmetrical Femoral Bruits: no Amputation: no SKIN Rash: no Cyanosis: no Embolic phemonenon: no Bruising: no NEURO Alert and Oriented to person, place and time: yes Non focal: yes  PSYCH: Pt appears to have normal affect  Assessment and Plan   75 y.o. male with  ICD-10-CM ICD-9-CM  1. Essential hypertension with goal blood pressure less than 140/90-blood pressure improved with lisinopril-hydrochlorothiazide. Will continue with this as well as dash diet. I10 401.9  2. Atypical chest pain-no evidence of ischemia on functional study done 1 year ago however his functional study last week revealed evidence of inferior ischemia. His symptoms persist. Will need to proceed with evaluation for possible progression of coronary disease. Left heart cath will need to be scheduled. Risk and benefits of this procedure were discussed with the patient. R07.89 786.59   3. Mixed hyperlipidemia-low-fat diet and continue with atorvastatin E78.2 272.2  4. Hyperlipidemia, mixed E78.2 272.2  5. Patient with progressive  shortness of breath. Also has exertional chest pain. These symptoms may be related to ischemia. Further evaluation with a heart cath.  Return in about 4 weeks (around 02/18/2016).  These notes generated with voice recognition software. I apologize for typographical errors.  Sydnee Levans, MD

## 2016-01-28 NOTE — Progress Notes (Signed)
Pt clinicallly stable post heart cath with vss, no bleeding nor hematoma at right groin site.

## 2016-01-28 NOTE — Discharge Instructions (Signed)

## 2016-06-26 ENCOUNTER — Encounter: Payer: Self-pay | Admitting: *Deleted

## 2016-06-29 ENCOUNTER — Encounter: Payer: Self-pay | Admitting: *Deleted

## 2016-06-29 ENCOUNTER — Ambulatory Visit: Payer: Medicare Other | Admitting: Anesthesiology

## 2016-06-29 ENCOUNTER — Ambulatory Visit
Admission: RE | Admit: 2016-06-29 | Discharge: 2016-06-29 | Disposition: A | Discharge status: Home / Self Care | Payer: Medicare Other | Source: Ambulatory Visit | Attending: Gastroenterology | Admitting: Gastroenterology

## 2016-06-29 ENCOUNTER — Encounter
Admission: RE | Disposition: A | Discharge status: Home / Self Care | Payer: Self-pay | Source: Ambulatory Visit | Attending: Gastroenterology

## 2016-06-29 DIAGNOSIS — K222 Esophageal obstruction: Secondary | ICD-10-CM | POA: Diagnosis not present

## 2016-06-29 DIAGNOSIS — Z85828 Personal history of other malignant neoplasm of skin: Secondary | ICD-10-CM | POA: Diagnosis not present

## 2016-06-29 DIAGNOSIS — E785 Hyperlipidemia, unspecified: Secondary | ICD-10-CM | POA: Diagnosis not present

## 2016-06-29 DIAGNOSIS — K2270 Barrett's esophagus without dysplasia: Secondary | ICD-10-CM | POA: Insufficient documentation

## 2016-06-29 DIAGNOSIS — K2950 Unspecified chronic gastritis without bleeding: Secondary | ICD-10-CM | POA: Insufficient documentation

## 2016-06-29 DIAGNOSIS — Z79899 Other long term (current) drug therapy: Secondary | ICD-10-CM | POA: Diagnosis not present

## 2016-06-29 DIAGNOSIS — I10 Essential (primary) hypertension: Secondary | ICD-10-CM | POA: Insufficient documentation

## 2016-06-29 DIAGNOSIS — K3189 Other diseases of stomach and duodenum: Secondary | ICD-10-CM | POA: Insufficient documentation

## 2016-06-29 DIAGNOSIS — B3781 Candidal esophagitis: Secondary | ICD-10-CM | POA: Diagnosis not present

## 2016-06-29 DIAGNOSIS — K210 Gastro-esophageal reflux disease with esophagitis: Secondary | ICD-10-CM | POA: Insufficient documentation

## 2016-06-29 DIAGNOSIS — K449 Diaphragmatic hernia without obstruction or gangrene: Secondary | ICD-10-CM | POA: Diagnosis not present

## 2016-06-29 HISTORY — PX: ESOPHAGOGASTRODUODENOSCOPY (EGD) WITH PROPOFOL: SHX5813

## 2016-06-29 LAB — KOH PREP: KOH Prep: NONE SEEN

## 2016-06-29 SURGERY — ESOPHAGOGASTRODUODENOSCOPY (EGD) WITH PROPOFOL
Anesthesia: General

## 2016-06-29 MED ORDER — SODIUM CHLORIDE 0.9 % IV SOLN
INTRAVENOUS | Status: DC
  Administered 2016-06-29 (×2): via INTRAVENOUS

## 2016-06-29 MED ORDER — PHENYLEPHRINE HCL 10 MG/ML IJ SOLN
INTRAMUSCULAR | Status: DC | PRN
  Administered 2016-06-29: 100 ug via INTRAVENOUS

## 2016-06-29 MED ORDER — PHENYLEPHRINE HCL 10 MG/ML IJ SOLN
INTRAMUSCULAR | Status: AC
  Filled 2016-06-29: qty 1

## 2016-06-29 MED ORDER — PROPOFOL 500 MG/50ML IV EMUL
INTRAVENOUS | Status: AC
  Filled 2016-06-29: qty 50

## 2016-06-29 MED ORDER — PROPOFOL 10 MG/ML IV BOLUS
INTRAVENOUS | Status: DC | PRN
  Administered 2016-06-29: 100 mg via INTRAVENOUS

## 2016-06-29 MED ORDER — SODIUM CHLORIDE 0.9 % IV SOLN: INTRAVENOUS | Status: DC

## 2016-06-29 MED ORDER — PROPOFOL 10 MG/ML IV BOLUS
INTRAVENOUS | Status: AC
  Filled 2016-06-29: qty 20

## 2016-06-29 MED ORDER — PROPOFOL 500 MG/50ML IV EMUL
INTRAVENOUS | Status: DC | PRN
Start: 2016-06-29 — End: 2016-06-29
  Administered 2016-06-29: 150 ug/kg/min via INTRAVENOUS

## 2016-06-29 NOTE — Anesthesia Procedure Notes (Signed)
Date/Time: 06/29/2016 7:55 AM Performed by: Nelda Marseille Pre-anesthesia Checklist: Patient identified, Emergency Drugs available, Suction available, Patient being monitored and Timeout performed Oxygen Delivery Method: Nasal cannula

## 2016-06-29 NOTE — Anesthesia Preprocedure Evaluation (Signed)
Anesthesia Evaluation  Patient identified by MRN, date of birth, ID band Patient awake    Reviewed: Allergy & Precautions, NPO status , Patient's Chart, lab work & pertinent test results  History of Anesthesia Complications Negative for: history of anesthetic complications  Airway Mallampati: II  TM Distance: >3 FB Neck ROM: Full    Dental no notable dental hx.    Pulmonary neg pulmonary ROS, neg sleep apnea, neg COPD,    breath sounds clear to auscultation- rhonchi (-) wheezing      Cardiovascular Exercise Tolerance: Good hypertension, Pt. 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   Dental advisory given  Plan Discussed with: CRNA and Anesthesiologist  Anesthesia Plan Comments:          Anesthesia Quick Evaluation

## 2016-06-29 NOTE — Transfer of Care (Signed)
Immediate Anesthesia Transfer of Care Note  Patient: Steve Rhodes.  Procedure(s) Performed: Procedure(s): ESOPHAGOGASTRODUODENOSCOPY (EGD) WITH PROPOFOL (N/A)  Patient Location: PACU  Anesthesia Type:General  Level of Consciousness: sedated  Airway & Oxygen Therapy: Patient Spontanous Breathing and Patient connected to nasal cannula oxygen  Post-op Assessment: Report given to RN and Post -op Vital signs reviewed and stable  Post vital signs: Reviewed and stable  Last Vitals:  Vitals:   06/29/16 0653  BP: 126/70  Pulse: 63  Resp: 16  Temp: 37.2 C    Last Pain:  Vitals:   06/29/16 0653  TempSrc: Tympanic         Complications: No apparent anesthesia complications

## 2016-06-29 NOTE — Op Note (Signed)
[facility] Gastroenterology Patient Name: Steve Rhodes Procedure Date: 06/29/2016 7:38 AM MRN: [id number] Account #: [id number] Date of Birth: 12-Feb-1941 Admit Type: Outpatient Age: 76 Room: [facility] ENDO ROOM 3 Gender: Male Note Status: Finalized Procedure:            Upper GI endoscopy Indications:          Surveillance procedure, Follow-up of Barrett's esophagus Providers:            Lollie Sails, MD Referring MD:         Theresia Lo, MD (Referring MD) Medicines:            Monitored Anesthesia Care Complications:        No immediate complications. Procedure:            Pre-Anesthesia Assessment:                       - ASA Grade Assessment: II - A patient with mild                        systemic disease.                       After obtaining informed consent, the endoscope was                        passed under direct vision. Throughout the procedure,                        the patient's blood pressure, pulse, and oxygen                        saturations were monitored continuously. The Endoscope                        was introduced through the mouth, and advanced to the                        third part of duodenum. The upper GI endoscopy was                        accomplished without difficulty. The patient tolerated                        the procedure well. Findings:      There were esophageal mucosal changes secondary to established       short-segment Barrett's disease present at the gastroesophageal       junction. The maximum longitudinal extent of these mucosal changes was 1       cm in length. Mucosa was biopsied with a cold forceps for histology. One       specimen bottle was sent to pathology, 4 quadrants biopsied. There was       no evidence of a nodulatr lesion or appearance of adenoma.      Patchy candidiasis was found in the lower third of the esophagus. Cells       for cytology were obtained by brushing.      A widely patent  intermittant Schatzki ring (acquired) was found at the       gastroesophageal junction.      The exam of the esophagus was otherwise normal.  Patchy moderate inflammation characterized by adherent blood, congestion       (edema), erosions and erythema was found in the gastric antrum and in       the prepyloric region of the stomach. Biopsies were taken with a cold       forceps for histology. Biopsies were taken with a cold forceps for       Helicobacter pylori testing.      Two 3 mm submucosal papules (nodules) with no bleeding and no stigmata       of recent bleeding were found on the anterior wall of the gastric body       and on the posterior wall of the gastric antrum. Biopsies were taken       with a cold forceps for histology.      A small hiatal hernia was present.      The cardia and gastric fundus were normal on retroflexion otherwise.      The examined duodenum was normal. Impression:           - Esophageal mucosal changes secondary to established                        short-segment Barrett's disease. Biopsied.                       - Monilial esophagitis. Cells for cytology obtained.                       - Widely patent Schatzki ring.                       - Erosive gastritis. Biopsied.                       - Two submucosal papules (nodules) found in the                        stomach. Biopsied.                       - Small hiatal hernia.                       - Normal examined duodenum. Recommendation:       - Discharge patient to home.                       - Return to GI clinic in 2 weeks.                       - Use Prilosec (omeprazole) 20 mg PO BID [duration]. Procedure Code(s):    --- Professional ---                       [phone], Esophagogastroduodenoscopy, flexible, transoral;                        with biopsy, single or multiple Diagnosis Code(s):    --- Professional ---                       K22.70, Barrett's esophagus without dysplasia                        B37.81,  Candidal esophagitis                       K22.2, Esophageal obstruction                       K29.60, Other gastritis without bleeding                       K31.89, Other diseases of stomach and duodenum                       K44.9, Diaphragmatic hernia without obstruction or                        gangrene CPT copyright 2016 American Medical Association. All rights reserved. The codes documented in this report are preliminary and upon coder review may  be revised to meet current compliance requirements. Lollie Sails, MD 06/29/2016 8:26:18 AM This report has been signed electronically. Number of Addenda: 0 Note Initiated On: 06/29/2016 7:38 AM      [facility]

## 2016-06-29 NOTE — Anesthesia Postprocedure Evaluation (Signed)
Anesthesia Post Note  Patient: Steve Rhodes.  Procedure(s) Performed: Procedure(s) (LRB): ESOPHAGOGASTRODUODENOSCOPY (EGD) WITH PROPOFOL (N/A)  Patient location during evaluation: Endoscopy Anesthesia Type: General Level of consciousness: awake and alert and oriented Pain management: pain level controlled Vital Signs Assessment: post-procedure vital signs reviewed and stable Respiratory status: spontaneous breathing, nonlabored ventilation and respiratory function stable Cardiovascular status: blood pressure returned to baseline and stable Postop Assessment: no signs of nausea or vomiting Anesthetic complications: no     Last Vitals:  Vitals:   06/29/16 0843 06/29/16 0853  BP: 102/66 105/69  Pulse: 68 62  Resp: 17 20  Temp:      Last Pain:  Vitals:   06/29/16 0823  TempSrc: Tympanic                 Andreah Goheen

## 2016-06-29 NOTE — H&P (Signed)
Outpatient short stay form Pre-procedure 06/29/2016 7:47 AM Lollie Sails MD  Primary Physician: Dr. Starling Manns  Reason for visit:  EGD  History of present illness:  Patient is a 76 year old male presenting today for follow-up EGD. He has a personal history of adenomatous esophageal polyp was noted in endoscopy on 05/10/2015. This was removed. Subsequent biopsy showed him to have only Barrett's esophagus in that location. Procedure was 06/10/2015. He currently takes a daily proton pump inhibitor. He has no dysphagia. He denies use of any aspirin or blood thinning agents.   Current Facility-Administered Medications:  .  0.9 %  sodium chloride infusion, , Intravenous, Continuous, Lollie Sails, MD, Last Rate: 20 mL/hr at 06/29/16 0714 .  0.9 %  sodium chloride infusion, , Intravenous, Continuous, Lollie Sails, MD  Prescriptions Prior to Admission  Medication Sig Dispense Refill Last Dose  . doxepin (SINEQUAN) 10 MG capsule Take 10 mg by mouth.   06/26/2016  . fluorouracil (ADRUCIL) 500 MG/10ML SOLN Inject into the vein once.     Marland Kitchen HYDROcodone-acetaminophen (NORCO/VICODIN) 5-325 MG tablet Take 1 tablet by mouth every 6 (six) hours as needed for moderate pain.     Marland Kitchen lisinopril-hydrochlorothiazide (PRINZIDE,ZESTORETIC) 20-25 MG tablet Take 1 tablet by mouth daily.   06/28/2016 at Unknown time  . omeprazole (PRILOSEC) 40 MG capsule Take 40 mg by mouth daily.   06/26/2016  . valACYclovir (VALTREX) 1000 MG tablet Take 1,000 mg by mouth 2 (two) times daily.     Marland Kitchen atorvastatin (LIPITOR) 10 MG tablet Take 10 mg by mouth daily.   06/27/2016  . tamsulosin (FLOMAX) 0.4 MG CAPS capsule Take 0.4 mg by mouth daily as needed (for nausea).    06/26/2016  . triamcinolone cream (KENALOG) 0.1 % Apply 1 application topically as needed (for rashy areas).   Past Week at Unknown time     Allergies  Allergen Reactions  . Latex Hives and Rash  . Sulfa Antibiotics Other (See Comments)    Reaction in  childhood     Past Medical History:  Diagnosis Date  . Cancer ([place])    skin cancer  . Hyperlipidemia   . Hypertension   . Mitral valve disorder     Review of systems:      Physical Exam    Heart and lungs: Regular rate and rhythm without rub or gallop, lungs are bilaterally clear.    HEENT: Septic atraumatic eyes are anicteric    Other:     Pertinant exam for procedure: Soft nontender nondistended bowel sounds positive normoactive.    Planned proceedures: EGD and indicated procedures. I have discussed the risks benefits and complications of procedures to include not limited to bleeding, infection, perforation and the risk of sedation and the patient wishes to proceed.    Lollie Sails, MD Gastroenterology 06/29/2016  7:47 AM

## 2016-06-30 ENCOUNTER — Encounter: Payer: Self-pay | Admitting: Gastroenterology

## 2016-07-01 LAB — SURGICAL PATHOLOGY

## 2017-08-16 ENCOUNTER — Other Ambulatory Visit: Payer: Self-pay | Admitting: Gastroenterology

## 2017-08-16 DIAGNOSIS — R1013 Epigastric pain: Secondary | ICD-10-CM

## 2017-08-19 ENCOUNTER — Encounter: Admit: 2017-08-19 | Discharge: 2017-08-20 | Payer: MEDICARE

## 2017-08-19 DIAGNOSIS — L918 Other hypertrophic disorders of the skin: Secondary | ICD-10-CM

## 2017-08-19 DIAGNOSIS — B001 Herpesviral vesicular dermatitis: Secondary | ICD-10-CM

## 2017-08-19 DIAGNOSIS — D485 Neoplasm of uncertain behavior of skin: Secondary | ICD-10-CM

## 2017-08-19 DIAGNOSIS — L570 Actinic keratosis: Secondary | ICD-10-CM

## 2017-08-19 DIAGNOSIS — L820 Inflamed seborrheic keratosis: Secondary | ICD-10-CM

## 2017-08-19 DIAGNOSIS — L111 Transient acantholytic dermatosis [Grover]: Principal | ICD-10-CM

## 2017-08-19 MED ORDER — DOXEPIN 25 MG CAPSULE
ORAL_CAPSULE | Freq: Every evening | ORAL | 3 refills | 0 days | Status: CP
Start: 2017-08-19 — End: 2018-05-12

## 2017-08-19 MED ORDER — CETIRIZINE 10 MG TABLET
ORAL_TABLET | Freq: Every day | ORAL | 3 refills | 0 days | Status: CP
Start: 2017-08-19 — End: 2018-08-19

## 2017-08-19 MED ORDER — VALACYCLOVIR 500 MG TABLET
ORAL_TABLET | 3 refills | 0 days | Status: CP
Start: 2017-08-19 — End: 2018-06-23

## 2017-08-23 MED ORDER — TRIAMCINOLONE ACETONIDE 0.1 % TOPICAL CREAM
3 refills | 0 days | Status: CP
Start: 2017-08-23 — End: 2018-12-01

## 2017-08-31 ENCOUNTER — Ambulatory Visit
Admission: RE | Admit: 2017-08-31 | Discharge: 2017-08-31 | Disposition: A | Discharge status: Home / Self Care | Payer: Medicare Other | Source: Ambulatory Visit | Attending: Gastroenterology | Admitting: Gastroenterology

## 2017-08-31 DIAGNOSIS — R1013 Epigastric pain: Secondary | ICD-10-CM | POA: Diagnosis not present

## 2017-08-31 MED ORDER — TECHNETIUM TC 99M MEBROFENIN IV KIT
5.0900 | PACK | Freq: Once | INTRAVENOUS | Status: AC | PRN
  Administered 2017-08-31: 5.09 via INTRAVENOUS

## 2017-10-14 ENCOUNTER — Encounter: Payer: Self-pay | Admitting: *Deleted

## 2017-11-08 ENCOUNTER — Other Ambulatory Visit: Payer: Self-pay | Admitting: Family Medicine

## 2017-11-08 DIAGNOSIS — Z8673 Personal history of transient ischemic attack (TIA), and cerebral infarction without residual deficits: Secondary | ICD-10-CM

## 2017-11-10 ENCOUNTER — Ambulatory Visit
Admission: RE | Admit: 2017-11-10 | Discharge: 2017-11-10 | Disposition: A | Discharge status: Home / Self Care | Payer: Medicare Other | Source: Ambulatory Visit | Attending: Family Medicine | Admitting: Family Medicine

## 2017-11-10 DIAGNOSIS — Z8673 Personal history of transient ischemic attack (TIA), and cerebral infarction without residual deficits: Secondary | ICD-10-CM | POA: Insufficient documentation

## 2017-11-11 ENCOUNTER — Ambulatory Visit (INDEPENDENT_AMBULATORY_CARE_PROVIDER_SITE_OTHER): Payer: Medicare Other | Admitting: General Surgery

## 2017-11-11 ENCOUNTER — Encounter: Payer: Self-pay | Admitting: General Surgery

## 2017-11-11 VITALS — BP 142/64 | HR 66 | Resp 14 | Ht 73.0 in | Wt 251.0 lb

## 2017-11-11 DIAGNOSIS — R948 Abnormal results of function studies of other organs and systems: Secondary | ICD-10-CM

## 2017-11-11 DIAGNOSIS — K529 Noninfective gastroenteritis and colitis, unspecified: Secondary | ICD-10-CM | POA: Diagnosis not present

## 2017-11-11 NOTE — Progress Notes (Signed)
Patient ID: Steve Dial., male   DOB: April 04, 1941, 77 y.o.   MRN: [id number]  Chief Complaint  Patient presents with  . Abdominal Pain    HPI Steve Lafoe. is a 77 y.o. male here today for a evaluation of gallstones referred by Dr Ubaldo Glassing. Patient had a ultrasound and HIDA scan done. He states he has had right abdominal pain but the reflux was what was the worst He states he has had reflux issues since 2016 and progressively worse.Marland KitchenHe states that he eats and then goes to the bathroom for BM for the past few years. He states the pain last about an hour then goes away after a BM. He had abdominal ultrasound and HIDA scan 08-31-17. He states he slept the entire test. He denies trouble swallowing, and no reflux at night. Last Wednesday he thought he had a mini stroke left face with large blood vessel and had a doppler yesterday. His vision was blurred and he was nauseated. 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  HPI  Past Medical History:  Diagnosis Date  . 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 Cancer ([place])    skin cancer  . Colon polyp 04/2015   TUBULAR ADENOMA  . GERD (gastroesophageal reflux disease)   . Hyperlipidemia   . Hypertension   . Mitral valve disorder     Past Surgical History:  Procedure Laterality Date  . CARDIAC CATHETERIZATION Left 01/28/2016   Procedure: Left Heart Cath and Coronary Angiography;  Surgeon: Teodoro Spray, MD;  Location: [place] CV LAB;  Service: Cardiovascular;  Laterality: Left;  . COLONOSCOPY WITH PROPOFOL N/A 05/10/2015   Procedure: COLONOSCOPY WITH PROPOFOL;  Surgeon: Hulen Luster, MD;  Location: [facility] ENDOSCOPY;  Service: Gastroenterology;  Laterality: N/A;  . ESOPHAGOGASTRODUODENOSCOPY (EGD) WITH PROPOFOL N/A 05/10/2015   Procedure: ESOPHAGOGASTRODUODENOSCOPY (EGD) WITH PROPOFOL;  Surgeon: Hulen Luster, MD;  Location: [facility] ENDOSCOPY;  Service: Gastroenterology;  Laterality: N/A;  . ESOPHAGOGASTRODUODENOSCOPY (EGD) WITH PROPOFOL N/A  06/10/2015   Procedure: ESOPHAGOGASTRODUODENOSCOPY (EGD) WITH PROPOFOL;  Surgeon: Hulen Luster, MD;  Location: [facility] ENDOSCOPY;  Service: Gastroenterology;  Laterality: N/A;  . ESOPHAGOGASTRODUODENOSCOPY (EGD) WITH PROPOFOL N/A 06/29/2016   Procedure: ESOPHAGOGASTRODUODENOSCOPY (EGD) WITH PROPOFOL;  Surgeon: Lollie Sails, MD;  Location: [facility] ENDOSCOPY;  Service: Endoscopy;  Laterality: N/A;  . SKIN CANCER EXCISION     on arms    Family History  Problem Relation Age of Onset  . Colon cancer Neg Hx     Social History Social History   Tobacco Use  . Smoking status: Never Smoker  . Smokeless tobacco: Never Used  Substance Use Topics  . Alcohol use: No  . Drug use: No    Allergies  Allergen Reactions  . Latex Hives and Rash  . Sulfa Antibiotics Other (See Comments)    Reaction in childhood    Current Outpatient Medications  Medication Sig Dispense Refill  . atorvastatin (LIPITOR) 10 MG tablet Take 10 mg by mouth daily.    . cetirizine (ZYRTEC) 10 MG tablet Take 10 mg by mouth daily.    Marland Kitchen doxepin (SINEQUAN) 10 MG capsule Take 10 mg by mouth.    Marland Kitchen lisinopril-hydrochlorothiazide (PRINZIDE,ZESTORETIC) 20-25 MG tablet Take 1 tablet by mouth daily.    Marland Kitchen oxybutynin (DITROPAN) 5 MG tablet Take 5 mg by mouth at bedtime.    . pantoprazole (PROTONIX) 40 MG tablet Take 40 mg by mouth daily.    Marland Kitchen triamcinolone cream (KENALOG) 0.1 %  Apply 1 application topically as needed (for rashy areas).    . valACYclovir (VALTREX) 1000 MG tablet Take 1,000 mg by mouth 2 (two) times daily.    Marland Kitchen zolpidem (AMBIEN) 10 MG tablet Take 10 mg by mouth at bedtime as needed for sleep.     No current facility-administered medications for this visit.     Review of Systems Review of Systems  Constitutional: Negative.   Respiratory: Negative.   Cardiovascular: Negative.   Gastrointestinal: Negative for nausea and vomiting.    Blood pressure (!) 142/64, pulse 66, resp. rate 14, height 6\' 1"  (1.854 m), weight  251 lb (113.9 kg), SpO2 96 %.  Physical Exam Physical Exam  Constitutional: He is oriented to person, place, and time. He appears well-developed and well-nourished.  HENT:  Mouth/Throat: No oropharyngeal exudate.  Eyes: No scleral icterus.  Neck: Neck supple.  Cardiovascular: Normal rate, regular rhythm and normal heart sounds.  Pulmonary/Chest: Effort normal and breath sounds normal.  Abdominal: Soft. Normal appearance and bowel sounds are normal. There is no tenderness.  Lymphadenopathy:    He has no cervical adenopathy.  Neurological: He is alert and oriented to person, place, and time.  Skin: Skin is warm and dry.  Psychiatric: His behavior is normal.    Data Reviewed Carotid ultrasound dated Nov 10, 2017 showed no evidence of high-grade stenosis or ulcerated plaque.  HIDA scan with Ensure simulation dated August 31, 2017 showed an ejection fraction of 25%.  No associated symptoms.  Abdominal ultrasound of August 31, 2017 showed evidence of fatty infiltration of the liver.  Unremarkable gallbladder.  Laboratory studies dated August 16, 2017 showed normal liver panel.  Normal transaminases and alkaline phosphatase. CBC of the same date showed a hemoglobin of 13.1, unchanged over the past 18 months.  MCV 94.  White blood cell count 6000.  Based bar platelet count of 218,000  Assessment    Asymptomatic hypofunctioning gallbladder.  Postprandial diarrhea unchanged over the years.    Plan  At this time, the presence of diarrhea is his only symptom, absence of abdominal pain and failure to produce diarrhea after the delightful stimulant provided during his HIDA scan, I would recommend against cholecystectomy.  Should the patient develop pain, or notice a dietary pattern, would be glad to reassess at that time.  The patient is aware to call back for any questions or new concerns.    HPI, Physical Exam, Assessment and Plan have been scribed under the direction and in the  presence of Robert Bellow, MD. Karie Fetch, RN  I have completed the exam and reviewed the above documentation for accuracy and completeness.  I agree with the above.  Haematologist has been used and any errors in dictation or transcription are unintentional.  Hervey Ard, M.D., F.A.C.S.  Steve Rhodes 11/12/2017, 5:33 PM

## 2017-11-11 NOTE — Patient Instructions (Addendum)
The patient is aware to call back for any questions or new concerns.  

## 2017-11-12 DIAGNOSIS — K219 Gastro-esophageal reflux disease without esophagitis: Secondary | ICD-10-CM | POA: Insufficient documentation

## 2017-11-12 DIAGNOSIS — R948 Abnormal results of function studies of other organs and systems: Secondary | ICD-10-CM | POA: Insufficient documentation

## 2017-11-18 ENCOUNTER — Encounter: Admit: 2017-11-18 | Discharge: 2017-11-19 | Payer: MEDICARE

## 2017-11-18 DIAGNOSIS — L820 Inflamed seborrheic keratosis: Secondary | ICD-10-CM

## 2017-11-18 DIAGNOSIS — B001 Herpesviral vesicular dermatitis: Secondary | ICD-10-CM

## 2017-11-18 DIAGNOSIS — L111 Transient acantholytic dermatosis [Grover]: Secondary | ICD-10-CM

## 2017-11-18 DIAGNOSIS — L299 Pruritus, unspecified: Secondary | ICD-10-CM

## 2017-11-18 DIAGNOSIS — L570 Actinic keratosis: Principal | ICD-10-CM

## 2018-02-17 ENCOUNTER — Encounter: Admit: 2018-02-17 | Discharge: 2018-02-18 | Payer: MEDICARE

## 2018-02-17 DIAGNOSIS — L570 Actinic keratosis: Secondary | ICD-10-CM

## 2018-02-17 DIAGNOSIS — Z85828 Personal history of other malignant neoplasm of skin: Secondary | ICD-10-CM

## 2018-02-17 DIAGNOSIS — L820 Inflamed seborrheic keratosis: Secondary | ICD-10-CM

## 2018-02-17 DIAGNOSIS — D485 Neoplasm of uncertain behavior of skin: Principal | ICD-10-CM

## 2018-02-17 DIAGNOSIS — L111 Transient acantholytic dermatosis [Grover]: Secondary | ICD-10-CM

## 2018-05-12 ENCOUNTER — Ambulatory Visit: Admit: 2018-05-12 | Discharge: 2018-05-13 | Payer: MEDICARE

## 2018-05-12 DIAGNOSIS — L111 Transient acantholytic dermatosis [Grover]: Secondary | ICD-10-CM

## 2018-05-12 DIAGNOSIS — D485 Neoplasm of uncertain behavior of skin: Principal | ICD-10-CM

## 2018-05-12 DIAGNOSIS — L820 Inflamed seborrheic keratosis: Secondary | ICD-10-CM

## 2018-05-12 DIAGNOSIS — L570 Actinic keratosis: Secondary | ICD-10-CM

## 2018-05-12 MED ORDER — DOXEPIN 10 MG/ML ORAL CONCENTRATE: mL | 5 refills | 0 days | Status: AC

## 2018-05-12 MED ORDER — DOXEPIN 10 MG/ML ORAL CONCENTRATE
ORAL | 5 refills | 0.00000 days | Status: CP
Start: 2018-05-12 — End: 2018-05-12

## 2018-05-12 MED ORDER — DOXEPIN 25 MG CAPSULE
ORAL_CAPSULE | ORAL | 2 refills | 0.00000 days | Status: CP
Start: 2018-05-12 — End: 2018-05-12

## 2018-05-12 MED ORDER — DOXEPIN 25 MG CAPSULE: capsule | 2 refills | 0 days | Status: AC

## 2018-06-23 MED ORDER — VALACYCLOVIR 500 MG TABLET
10 refills | 0 days | Status: CP
Start: 2018-06-23 — End: 2018-12-01

## 2018-07-13 ENCOUNTER — Encounter: Admit: 2018-07-13 | Discharge: 2018-07-14 | Payer: MEDICARE

## 2018-07-13 DIAGNOSIS — D0439 Carcinoma in situ of skin of other parts of face: Secondary | ICD-10-CM

## 2018-07-13 DIAGNOSIS — C44622 Squamous cell carcinoma of skin of right upper limb, including shoulder: Secondary | ICD-10-CM

## 2018-07-13 DIAGNOSIS — Z85828 Personal history of other malignant neoplasm of skin: Secondary | ICD-10-CM

## 2018-07-13 DIAGNOSIS — D489 Neoplasm of uncertain behavior, unspecified: Principal | ICD-10-CM

## 2018-07-13 DIAGNOSIS — L578 Other skin changes due to chronic exposure to nonionizing radiation: Secondary | ICD-10-CM

## 2018-08-31 ENCOUNTER — Encounter: Admit: 2018-08-31 | Discharge: 2018-09-01 | Payer: MEDICARE

## 2018-08-31 DIAGNOSIS — I10 Essential (primary) hypertension: Principal | ICD-10-CM

## 2018-08-31 DIAGNOSIS — L111 Transient acantholytic dermatosis [Grover]: Principal | ICD-10-CM

## 2018-08-31 DIAGNOSIS — C4492 Squamous cell carcinoma of skin, unspecified: Principal | ICD-10-CM

## 2018-08-31 DIAGNOSIS — L858 Other specified epidermal thickening: Principal | ICD-10-CM

## 2018-08-31 DIAGNOSIS — E7800 Pure hypercholesterolemia, unspecified: Principal | ICD-10-CM

## 2018-08-31 DIAGNOSIS — C4491 Basal cell carcinoma of skin, unspecified: Principal | ICD-10-CM

## 2018-08-31 DIAGNOSIS — D489 Neoplasm of uncertain behavior, unspecified: Principal | ICD-10-CM

## 2018-09-05 DIAGNOSIS — C4491 Basal cell carcinoma of skin, unspecified: Principal | ICD-10-CM

## 2018-11-21 ENCOUNTER — Ambulatory Visit: Admit: 2018-11-21 | Discharge: 2018-11-22 | Payer: MEDICARE

## 2018-11-21 DIAGNOSIS — C44319 Basal cell carcinoma of skin of other parts of face: Secondary | ICD-10-CM

## 2018-11-21 DIAGNOSIS — C4491 Basal cell carcinoma of skin, unspecified: Principal | ICD-10-CM

## 2018-11-21 DIAGNOSIS — L578 Other skin changes due to chronic exposure to nonionizing radiation: Secondary | ICD-10-CM

## 2018-12-01 ENCOUNTER — Ambulatory Visit: Admit: 2018-12-01 | Discharge: 2018-12-02 | Payer: MEDICARE

## 2018-12-01 DIAGNOSIS — L578 Other skin changes due to chronic exposure to nonionizing radiation: Secondary | ICD-10-CM

## 2018-12-01 DIAGNOSIS — D229 Melanocytic nevi, unspecified: Secondary | ICD-10-CM

## 2018-12-01 DIAGNOSIS — B001 Herpesviral vesicular dermatitis: Secondary | ICD-10-CM

## 2018-12-01 DIAGNOSIS — L570 Actinic keratosis: Secondary | ICD-10-CM

## 2018-12-01 DIAGNOSIS — W57XXXA Bitten or stung by nonvenomous insect and other nonvenomous arthropods, initial encounter: Secondary | ICD-10-CM

## 2018-12-01 DIAGNOSIS — L299 Pruritus, unspecified: Secondary | ICD-10-CM

## 2018-12-01 DIAGNOSIS — L820 Inflamed seborrheic keratosis: Secondary | ICD-10-CM

## 2018-12-01 DIAGNOSIS — Z85828 Personal history of other malignant neoplasm of skin: Secondary | ICD-10-CM

## 2018-12-01 DIAGNOSIS — L821 Other seborrheic keratosis: Secondary | ICD-10-CM

## 2018-12-01 DIAGNOSIS — L814 Other melanin hyperpigmentation: Secondary | ICD-10-CM

## 2018-12-01 DIAGNOSIS — D489 Neoplasm of uncertain behavior, unspecified: Principal | ICD-10-CM

## 2018-12-01 MED ORDER — VALACYCLOVIR 500 MG TABLET
10 refills | 0 days | Status: CP
Start: 2018-12-01 — End: ?

## 2018-12-01 MED ORDER — TRIAMCINOLONE ACETONIDE 0.1 % TOPICAL CREAM
3 refills | 0 days | Status: CP
Start: 2018-12-01 — End: ?

## 2018-12-01 MED ORDER — MUPIROCIN 2 % TOPICAL OINTMENT
Freq: Three times a day (TID) | TOPICAL | 1 refills | 0 days | Status: CP
Start: 2018-12-01 — End: 2018-12-01

## 2018-12-01 MED ORDER — FLUOROURACIL 5 % TOPICAL CREAM
Freq: Two times a day (BID) | TOPICAL | 0 refills | 0.00000 days | Status: CP
Start: 2018-12-01 — End: ?

## 2018-12-01 MED ORDER — MUPIROCIN 2 % TOPICAL OINTMENT: g | Freq: Three times a day (TID) | 1 refills | 0 days | Status: AC

## 2018-12-30 ENCOUNTER — Other Ambulatory Visit: Admission: RE | Admit: 2018-12-30 | Payer: Medicare Other | Source: Ambulatory Visit

## 2019-01-03 ENCOUNTER — Ambulatory Visit: Admit: 2019-01-03 | Payer: Medicare Other | Admitting: Gastroenterology

## 2019-01-03 SURGERY — ESOPHAGOGASTRODUODENOSCOPY (EGD) WITH PROPOFOL
Anesthesia: General

## 2019-01-31 ENCOUNTER — Ambulatory Visit: Admit: 2019-01-31 | Discharge: 2019-02-01 | Payer: MEDICARE | Attending: Dermatology | Primary: Dermatology

## 2019-01-31 DIAGNOSIS — C44222 Squamous cell carcinoma of skin of right ear and external auricular canal: Secondary | ICD-10-CM

## 2019-01-31 DIAGNOSIS — L989 Disorder of the skin and subcutaneous tissue, unspecified: Secondary | ICD-10-CM

## 2019-01-31 DIAGNOSIS — D489 Neoplasm of uncertain behavior, unspecified: Principal | ICD-10-CM

## 2019-02-02 MED ORDER — LIDOCAINE HCL 2 % MUCOSAL JELLY
Freq: Three times a day (TID) | TOPICAL | 1 refills | 0.00000 days | Status: CP
Start: 2019-02-02 — End: 2020-02-02

## 2019-02-10 MED ORDER — DOXYCYCLINE HYCLATE 100 MG CAPSULE
ORAL_CAPSULE | Freq: Two times a day (BID) | ORAL | 0 refills | 14.00000 days | Status: CP
Start: 2019-02-10 — End: 2019-02-24

## 2019-02-23 ENCOUNTER — Encounter: Admit: 2019-02-23 | Discharge: 2019-02-24 | Payer: MEDICARE

## 2019-02-23 DIAGNOSIS — L578 Other skin changes due to chronic exposure to nonionizing radiation: Secondary | ICD-10-CM

## 2019-02-23 DIAGNOSIS — L820 Inflamed seborrheic keratosis: Secondary | ICD-10-CM

## 2019-02-23 DIAGNOSIS — Z85828 Personal history of other malignant neoplasm of skin: Secondary | ICD-10-CM

## 2019-02-23 DIAGNOSIS — R52 Pain, unspecified: Secondary | ICD-10-CM

## 2019-02-23 MED ORDER — TRAMADOL 50 MG TABLET
ORAL_TABLET | 0 refills | 0 days | Status: CP
Start: 2019-02-23 — End: ?

## 2019-02-28 ENCOUNTER — Encounter: Admit: 2019-02-28 | Discharge: 2019-03-01 | Payer: MEDICARE

## 2019-02-28 DIAGNOSIS — Z85828 Personal history of other malignant neoplasm of skin: Secondary | ICD-10-CM

## 2019-02-28 DIAGNOSIS — C44222 Squamous cell carcinoma of skin of right ear and external auricular canal: Secondary | ICD-10-CM

## 2019-02-28 DIAGNOSIS — L578 Other skin changes due to chronic exposure to nonionizing radiation: Secondary | ICD-10-CM

## 2019-02-28 DIAGNOSIS — H61111 Acquired deformity of pinna, right ear: Secondary | ICD-10-CM

## 2019-03-07 ENCOUNTER — Encounter: Admit: 2019-03-07 | Discharge: 2019-03-08 | Payer: MEDICARE

## 2019-03-07 DIAGNOSIS — Z4800 Encounter for change or removal of nonsurgical wound dressing: Secondary | ICD-10-CM

## 2019-05-17 DIAGNOSIS — L111 Transient acantholytic dermatosis [Grover]: Principal | ICD-10-CM

## 2019-05-24 MED ORDER — DOXEPIN 10 MG/ML ORAL CONCENTRATE
0 refills | 0 days | Status: CP
Start: 2019-05-24 — End: ?

## 2019-07-06 ENCOUNTER — Ambulatory Visit: Admit: 2019-07-06 | Discharge: 2019-07-07 | Payer: MEDICARE

## 2019-07-06 MED ORDER — METHOTREXATE SODIUM 2.5 MG TABLET
ORAL_TABLET | 0 refills | 0 days | Status: CP
Start: 2019-07-06 — End: ?

## 2019-07-06 MED ORDER — FOLIC ACID 1 MG TABLET
ORAL_TABLET | 3 refills | 0 days | Status: CP
Start: 2019-07-06 — End: ?

## 2019-08-01 ENCOUNTER — Encounter: Admit: 2019-08-01 | Discharge: 2019-08-02 | Payer: MEDICARE

## 2019-08-01 MED ORDER — VALACYCLOVIR 500 MG TABLET
ORAL | 10 refills | 0.00000 days | Status: CP
Start: 2019-08-01 — End: ?

## 2019-08-01 MED ORDER — DOXEPIN 10 MG/ML ORAL CONCENTRATE
Freq: Every evening | ORAL | 2 refills | 40.00000 days | Status: CP
Start: 2019-08-01 — End: ?

## 2019-08-03 DIAGNOSIS — Z79899 Other long term (current) drug therapy: Principal | ICD-10-CM

## 2019-08-03 MED ORDER — METHOTREXATE SODIUM 2.5 MG TABLET
ORAL_TABLET | 2 refills | 0.00000 days | Status: CP
Start: 2019-08-03 — End: ?

## 2019-09-04 ENCOUNTER — Encounter: Admit: 2019-09-04 | Discharge: 2019-09-05 | Payer: MEDICARE

## 2019-09-08 DIAGNOSIS — M899 Disorder of bone, unspecified: Principal | ICD-10-CM

## 2019-09-11 ENCOUNTER — Ambulatory Visit: Admit: 2019-09-11 | Discharge: 2019-09-12 | Payer: MEDICARE

## 2019-09-11 DIAGNOSIS — M899 Disorder of bone, unspecified: Principal | ICD-10-CM

## 2019-09-26 ENCOUNTER — Ambulatory Visit: Admit: 2019-09-26 | Discharge: 2019-09-27 | Payer: MEDICARE

## 2019-09-26 DIAGNOSIS — D492 Neoplasm of unspecified behavior of bone, soft tissue, and skin: Principal | ICD-10-CM

## 2019-09-26 DIAGNOSIS — D099 Carcinoma in situ, unspecified: Principal | ICD-10-CM

## 2019-09-26 DIAGNOSIS — Z79899 Other long term (current) drug therapy: Principal | ICD-10-CM

## 2019-09-26 DIAGNOSIS — L111 Transient acantholytic dermatosis [Grover]: Principal | ICD-10-CM

## 2019-09-26 MED ORDER — METHOTREXATE SODIUM 2.5 MG TABLET
ORAL_TABLET | 2 refills | 0.00000 days | Status: CP
Start: 2019-09-26 — End: ?

## 2019-09-26 MED ORDER — FOLIC ACID 1 MG TABLET
ORAL_TABLET | 3 refills | 0.00000 days | Status: CP
Start: 2019-09-26 — End: ?

## 2019-11-07 ENCOUNTER — Encounter: Admit: 2019-11-07 | Discharge: 2019-11-08 | Payer: MEDICARE

## 2019-11-07 DIAGNOSIS — C44319 Basal cell carcinoma of skin of other parts of face: Principal | ICD-10-CM

## 2019-11-07 DIAGNOSIS — L111 Transient acantholytic dermatosis [Grover]: Principal | ICD-10-CM

## 2019-11-07 DIAGNOSIS — L299 Pruritus, unspecified: Principal | ICD-10-CM

## 2019-11-07 DIAGNOSIS — L570 Actinic keratosis: Principal | ICD-10-CM

## 2019-11-07 MED ORDER — FOLIC ACID 1 MG TABLET
ORAL_TABLET | 5 refills | 0.00000 days | Status: CP
Start: 2019-11-07 — End: ?

## 2019-11-07 MED ORDER — METHOTREXATE SODIUM 2.5 MG TABLET
ORAL_TABLET | ORAL | 1 refills | 28.00000 days | Status: CP
Start: 2019-11-07 — End: 2020-01-06

## 2019-11-07 MED ORDER — DOXEPIN 10 MG/ML ORAL CONCENTRATE
Freq: Every evening | ORAL | 2 refills | 40.00000 days | Status: CP
Start: 2019-11-07 — End: ?

## 2019-12-19 ENCOUNTER — Ambulatory Visit: Admit: 2019-12-19 | Discharge: 2019-12-20 | Payer: MEDICARE

## 2019-12-19 MED ORDER — FOLIC ACID 1 MG TABLET
ORAL_TABLET | 5 refills | 0.00000 days | Status: CP
Start: 2019-12-19 — End: ?

## 2019-12-19 MED ORDER — METHOTREXATE SODIUM 2.5 MG TABLET
ORAL_TABLET | ORAL | 2 refills | 28.00000 days | Status: CP
Start: 2019-12-19 — End: 2020-02-17

## 2019-12-26 ENCOUNTER — Encounter: Admit: 2019-12-26 | Discharge: 2019-12-27 | Payer: MEDICARE

## 2020-01-20 ENCOUNTER — Other Ambulatory Visit: Payer: Self-pay

## 2020-01-20 ENCOUNTER — Ambulatory Visit
Admission: EM | Admit: 2020-01-20 | Discharge: 2020-01-20 | Disposition: A | Discharge status: Home / Self Care | Payer: Medicare Other | Attending: Family Medicine | Admitting: Family Medicine

## 2020-01-20 ENCOUNTER — Ambulatory Visit (INDEPENDENT_AMBULATORY_CARE_PROVIDER_SITE_OTHER): Payer: Medicare Other

## 2020-01-20 DIAGNOSIS — Z20822 Contact with and (suspected) exposure to covid-19: Secondary | ICD-10-CM | POA: Diagnosis not present

## 2020-01-20 DIAGNOSIS — E785 Hyperlipidemia, unspecified: Secondary | ICD-10-CM | POA: Insufficient documentation

## 2020-01-20 DIAGNOSIS — K5900 Constipation, unspecified: Secondary | ICD-10-CM | POA: Insufficient documentation

## 2020-01-20 DIAGNOSIS — K760 Fatty (change of) liver, not elsewhere classified: Secondary | ICD-10-CM | POA: Insufficient documentation

## 2020-01-20 DIAGNOSIS — N400 Enlarged prostate without lower urinary tract symptoms: Secondary | ICD-10-CM | POA: Insufficient documentation

## 2020-01-20 DIAGNOSIS — I10 Essential (primary) hypertension: Secondary | ICD-10-CM | POA: Diagnosis not present

## 2020-01-20 DIAGNOSIS — R252 Cramp and spasm: Secondary | ICD-10-CM | POA: Insufficient documentation

## 2020-01-20 DIAGNOSIS — K219 Gastro-esophageal reflux disease without esophagitis: Secondary | ICD-10-CM | POA: Diagnosis not present

## 2020-01-20 DIAGNOSIS — R0602 Shortness of breath: Secondary | ICD-10-CM | POA: Diagnosis present

## 2020-01-20 DIAGNOSIS — I708 Atherosclerosis of other arteries: Secondary | ICD-10-CM | POA: Insufficient documentation

## 2020-01-20 DIAGNOSIS — R109 Unspecified abdominal pain: Secondary | ICD-10-CM | POA: Insufficient documentation

## 2020-01-20 DIAGNOSIS — Z7901 Long term (current) use of anticoagulants: Secondary | ICD-10-CM | POA: Insufficient documentation

## 2020-01-20 DIAGNOSIS — Z7984 Long term (current) use of oral hypoglycemic drugs: Secondary | ICD-10-CM | POA: Insufficient documentation

## 2020-01-20 DIAGNOSIS — N10 Acute tubulo-interstitial nephritis: Secondary | ICD-10-CM | POA: Insufficient documentation

## 2020-01-20 DIAGNOSIS — K828 Other specified diseases of gallbladder: Secondary | ICD-10-CM | POA: Diagnosis not present

## 2020-01-20 DIAGNOSIS — K449 Diaphragmatic hernia without obstruction or gangrene: Secondary | ICD-10-CM | POA: Diagnosis not present

## 2020-01-20 DIAGNOSIS — Z79899 Other long term (current) drug therapy: Secondary | ICD-10-CM | POA: Diagnosis not present

## 2020-01-20 HISTORY — DX: Transient acantholytic dermatosis (grover): L11.1

## 2020-01-20 LAB — CBC WITH DIFFERENTIAL/PLATELET
Abs Immature Granulocytes: 0.17 10*3/uL — ABNORMAL HIGH (ref 0.00–0.07)
Basophils Absolute: 0.1 10*3/uL (ref 0.0–0.1)
Basophils Relative: 0 %
Eosinophils Absolute: 0 10*3/uL (ref 0.0–0.5)
Eosinophils Relative: 0 %
HCT: 41.7 % (ref 39.0–52.0)
Hemoglobin: 13.7 g/dL (ref 13.0–17.0)
Immature Granulocytes: 1 %
Lymphocytes Relative: 6 %
Lymphs Abs: 1.3 10*3/uL (ref 0.7–4.0)
MCH: 32.2 pg (ref 26.0–34.0)
MCHC: 32.9 g/dL (ref 30.0–36.0)
MCV: 98.1 fL (ref 80.0–100.0)
Monocytes Absolute: 2 10*3/uL — ABNORMAL HIGH (ref 0.1–1.0)
Monocytes Relative: 9 %
Neutro Abs: 19.8 10*3/uL — ABNORMAL HIGH (ref 1.7–7.7)
Neutrophils Relative %: 84 %
Platelets: 195 10*3/uL (ref 150–400)
RBC: 4.25 MIL/uL (ref 4.22–5.81)
RDW: 14.8 % (ref 11.5–15.5)
WBC: 23.3 10*3/uL — ABNORMAL HIGH (ref 4.0–10.5)
nRBC: 0 % (ref 0.0–0.2)

## 2020-01-20 LAB — COMPREHENSIVE METABOLIC PANEL
ALT: 18 U/L (ref 0–44)
AST: 16 U/L (ref 15–41)
Albumin: 3.9 g/dL (ref 3.5–5.0)
Alkaline Phosphatase: 49 U/L (ref 38–126)
Anion gap: 9 (ref 5–15)
BUN: 20 mg/dL (ref 8–23)
CO2: 24 mmol/L (ref 22–32)
Calcium: 8.7 mg/dL — ABNORMAL LOW (ref 8.9–10.3)
Chloride: 100 mmol/L (ref 98–111)
Creatinine, Ser: 1.05 mg/dL (ref 0.61–1.24)
GFR calc Af Amer: >60 mL/min (ref >60)
GFR calc non Af Amer: >60 mL/min (ref >60)
Glucose, Bld: 152 mg/dL — ABNORMAL HIGH (ref 70–99)
Potassium: 3.6 mmol/L (ref 3.5–5.1)
Sodium: 133 mmol/L — ABNORMAL LOW (ref 135–145)
Total Bilirubin: 1.9 mg/dL — ABNORMAL HIGH (ref 0.3–1.2)
Total Protein: 7.3 g/dL (ref 6.5–8.1)

## 2020-01-20 LAB — LIPASE, BLOOD: Lipase: 19 U/L (ref 11–51)

## 2020-01-20 LAB — SARS CORONAVIRUS 2 AG (30 MIN TAT): SARS Coronavirus 2 Ag: NEGATIVE

## 2020-01-20 MED ORDER — ACETAMINOPHEN 325 MG PO TABS
650.0000 mg | ORAL_TABLET | Freq: Four times a day (QID) | ORAL | Status: DC | PRN
  Administered 2020-01-20: 16:00:00 650 mg via ORAL

## 2020-01-20 MED ORDER — CIPROFLOXACIN HCL 500 MG PO TABS: 500.0000 mg | ORAL_TABLET | Freq: Two times a day (BID) | ORAL | 0 refills | Status: DC

## 2020-01-20 NOTE — ED Provider Notes (Signed)
[facility]    CSN: [id number] Arrival date & time: 01/20/20  1452   History   Chief Complaint Chief Complaint  Patient presents with  . Shortness of Breath  . Leg Cramping   HPI   79 year old male presents with multiple complaints.   Patient reports that he believes he got bitten by an insect earlier in the week.  He states that he saw his primary on Tuesday and reported cramping particularly of his calf muscles.  He was given muscle relaxer.  He states that he has now developed several other additional symptoms.  Started today.  Reports fever, T-max 103.  Has some shortness of breath.  States that his taste does not appear normal.  He also reports that he has had some abdominal cramping.  Reports constipation.  Also reports few days of dysuria.  Currently febrile at one 1.7.  Denies sick contacts.  No relieving factors.  Past Medical History:  Diagnosis Date  . 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 Cancer ([place])    skin cancer  . Colon polyp 04/2015   TUBULAR ADENOMA  . GERD (gastroesophageal reflux disease)   . Grover's disease   . Hyperlipidemia   . Hypertension   . Mitral valve disorder     Patient Active Problem List   Diagnosis Date Noted  . Gastroesophageal reflux disease 11/12/2017  . Abnormal biliary HIDA scan 11/12/2017    Past Surgical History:  Procedure Laterality Date  . CARDIAC CATHETERIZATION Left 01/28/2016   Procedure: Left Heart Cath and Coronary Angiography;  Surgeon: Teodoro Spray, MD;  Location: [place] [facility];  Service: Cardiovascular;  Laterality: Left;  . COLONOSCOPY WITH PROPOFOL N/A 05/10/2015   Procedure: COLONOSCOPY WITH PROPOFOL;  Surgeon: Hulen Luster, MD;  Location: [facility] ENDOSCOPY;  Service: Gastroenterology;  Laterality: N/A;  . ESOPHAGOGASTRODUODENOSCOPY (EGD) WITH PROPOFOL N/A 05/10/2015   Procedure: ESOPHAGOGASTRODUODENOSCOPY (EGD) WITH PROPOFOL;  Surgeon: Hulen Luster, MD;  Location: [facility] ENDOSCOPY;  Service: Gastroenterology;   Laterality: N/A;  . ESOPHAGOGASTRODUODENOSCOPY (EGD) WITH PROPOFOL N/A 06/10/2015   Procedure: ESOPHAGOGASTRODUODENOSCOPY (EGD) WITH PROPOFOL;  Surgeon: Hulen Luster, MD;  Location: [facility] ENDOSCOPY;  Service: Gastroenterology;  Laterality: N/A;  . ESOPHAGOGASTRODUODENOSCOPY (EGD) WITH PROPOFOL N/A 06/29/2016   Procedure: ESOPHAGOGASTRODUODENOSCOPY (EGD) WITH PROPOFOL;  Surgeon: Lollie Sails, MD;  Location: [facility] ENDOSCOPY;  Service: Endoscopy;  Laterality: N/A;  . SKIN CANCER EXCISION     on arms       Home Medications    Prior to Admission medications   Medication Sig Start Date End Date Taking? Authorizing Provider  atorvastatin (LIPITOR) 10 MG tablet Take 10 mg by mouth daily.   Yes [provider]  cetirizine (ZYRTEC) 10 MG tablet Take 10 mg by mouth daily.   Yes [provider]  doxepin (SINEQUAN) 10 MG capsule Take 10 mg by mouth.   Yes [provider]  lisinopril-hydrochlorothiazide (PRINZIDE,ZESTORETIC) 20-25 MG tablet Take 1 tablet by mouth daily.   Yes [provider]  metFORMIN (GLUCOPHAGE) 500 MG tablet Take 500 mg by mouth daily. 11/29/19  Yes [provider]  methotrexate 2.5 MG tablet TAKE 5 TABLETS BY MOUTH ONCE A WEEK 01/01/20  Yes [provider]  oxybutynin (DITROPAN) 5 MG tablet Take 5 mg by mouth at bedtime.   Yes [provider]  pantoprazole (PROTONIX) 40 MG tablet Take 40 mg by mouth daily.   Yes [provider]  triamcinolone cream (KENALOG) 0.1 % Apply 1 application topically  as needed (for rashy areas).   Yes [provider]  valACYclovir (VALTREX) 1000 MG tablet Take 1,000 mg by mouth 2 (two) times daily.   Yes [provider]  zolpidem (AMBIEN) 10 MG tablet Take 10 mg by mouth at bedtime as needed for sleep.   Yes [provider]  ciprofloxacin (CIPRO) 500 MG tablet Take 1 tablet (500 mg total) by mouth 2 (two) times daily. 01/20/20   Coral Spikes, DO    Family  History Family History  Problem Relation Age of Onset  . Colon cancer Neg Hx     Social History Social History   Tobacco Use  . Smoking status: Never Smoker  . Smokeless tobacco: Never Used  Vaping Use  . Vaping Use: Never used  Substance Use Topics  . Alcohol use: No  . Drug use: No     Allergies   Latex and Sulfa antibiotics   Review of Systems Review of Systems Per HPI  Physical Exam Triage Vital Signs ED Triage Vitals  Enc Vitals Group     BP 01/20/20 1516 (!) 138/59     Pulse Rate 01/20/20 1516 (!) 108     Resp 01/20/20 1516 18     Temp 01/20/20 1516 (!) 101.7 F (38.7 C)     Temp Source 01/20/20 1516 Oral     SpO2 01/20/20 1516 97 %     Weight 01/20/20 1519 (!) 235 lb (106.6 kg)     Height 01/20/20 1519 6\' 1"  (1.854 m)     Head Circumference --      Peak Flow --      Pain Score 01/20/20 1518 6     Pain Loc --      Pain Edu? --      Excl. in [place]? --    No data found.  Updated Vital Signs BP (!) 138/59 (BP Location: Left Arm)   Pulse (!) 108   Temp (!) 101.7 F (38.7 C) (Oral)   Resp 18   Ht 6\' 1"  (1.854 m)   Wt (!) 106.6 kg   SpO2 97%   BMI 31.00 kg/m   Visual Acuity Right Eye Distance:   Left Eye Distance:   Bilateral Distance:    Right Eye Near:   Left Eye Near:    Bilateral Near:     Physical Exam Vitals and nursing note reviewed.  Constitutional:      General: He is not in acute distress.    Appearance: He is not ill-appearing.  HENT:     Head: Normocephalic and atraumatic.  Eyes:     General:        Right eye: No discharge.        Left eye: No discharge.     Conjunctiva/sclera: Conjunctivae normal.  Cardiovascular:     Rate and Rhythm: Regular rhythm. Tachycardia present.  Pulmonary:     Effort: Pulmonary effort is normal.     Breath sounds: Normal breath sounds. No wheezing or rales.  Abdominal:     Palpations: Abdomen is soft.     Comments: Mild Left sided abdominal tenderness.   Neurological:     Mental Status: He  is alert.  Psychiatric:        Mood and Affect: Mood normal.        Behavior: Behavior normal.    UC Treatments / Results  Labs (all labs ordered are listed, but only abnormal results are displayed) Labs Reviewed  CBC WITH DIFFERENTIAL/PLATELET - Abnormal;  Notable for the following components:      Result Value   WBC 23.3 (*)    Neutro Abs 19.8 (*)    Monocytes Absolute 2.0 (*)    Abs Immature Granulocytes 0.17 (*)    All other components within normal limits  COMPREHENSIVE METABOLIC PANEL - Abnormal; Notable for the following components:   Sodium 133 (*)    Glucose, Bld 152 (*)    Calcium 8.7 (*)    Total Bilirubin 1.9 (*)    All other components within normal limits  SARS CORONAVIRUS 2 AG (30 MIN TAT)  SARS CORONAVIRUS 2 (TAT 6-24 HRS)  LIPASE, BLOOD    EKG   Radiology CT ABDOMEN PELVIS WO CONTRAST  Result Date: 01/20/2020 CLINICAL DATA:  Fever and left-sided abdominal pain. EXAM: CT ABDOMEN AND PELVIS WITHOUT CONTRAST TECHNIQUE: Multidetector CT imaging of the abdomen and pelvis was performed following the standard protocol without IV contrast. COMPARISON:  None. FINDINGS: Lower chest: Minor basilar atelectasis. No pleural fluid or confluent airspace disease. Hepatobiliary: Decreased hepatic density consistent with steatosis. Mild gallbladder distention without calcified gallstone or pericholecystic inflammation. No biliary dilatation. Pancreas: No ductal dilatation or inflammation. Spleen: Normal in size without focal abnormality. Adrenals/Urinary Tract: No adrenal nodule. There is bilateral symmetric perinephric edema. No hydronephrosis. No renal or ureteral calculi. Urinary bladder is nondistended but thick walled. Mild perivesicular fat stranding. Stomach/Bowel: Tiny hiatal hernia. Stomach is unremarkable. There is no small bowel obstruction or evidence of inflammation. Appendix is not well-defined, there is no evidence of appendicitis. Moderate volume of stool throughout the  entire colon. There is sigmoid colonic tortuosity. No colonic wall thickening or pericolonic edema. No significant diverticular change. Moderate stool distends the rectum. Vascular/Lymphatic: Mild aorto bi-iliac atherosclerosis. No aortic aneurysm. Multiple small retroperitoneal and upper abdominal lymph nodes are not enlarged by size criteria. Reproductive: Enlarged spanning 5.8 cm. Periprosthetic fat is indistinct. No focal fluid collection. Other: No free air or ascites. Mild broad-based laxity of the anterior abdominal wall musculature without frank abdominal wall hernia. Musculoskeletal: Degenerative disc disease and facet hypertrophy in the lumbar spine. There are no acute or suspicious osseous abnormalities. IMPRESSION: 1. Urinary bladder wall thickening with perivesicular fat stranding, suspicious for cystitis. Recommend correlation with urinalysis. There is also symmetric perinephric edema which is of indeterminate acuity, may be chronic or seen in the setting of pyelonephritis. No urolithiasis. 2. 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 Prostatitis is considered. 3. Hepatic steatosis. 4. Moderate volume of colonic stool with stool distending the rectum, query constipation. Aortic Atherosclerosis (ICD10-I70.0). Electronically Signed   By: Keith Rake M.D.   On: 01/20/2020 17:09    Procedures Procedures (including critical care time)  Medications Ordered in UC Medications - No data to display  Initial Impression / Assessment and Plan / UC Course  I have reviewed the triage vital signs and the nursing notes.  Pertinent labs & imaging results that were available during my care of the patient were reviewed by me and considered in my medical decision making (see chart for details).    79 year old male presents with acute febrile illness.  Laboratory studies notable for marked leukocytosis with a white count of 23.3.  Given abdominal tenderness on exam, CT was obtained and  revealed findings consistent with UTI.  There was perinephric edema suggestive of pyelonephritis.  Patient also has enlarged prostate.  Placing empirically on Cipro while awaiting urine culture results.  Patient's urine dipstick was consistent with UTI.  Final Clinical Impressions(s) /  UC Diagnoses   Final diagnoses:  Acute pyelonephritis     Discharge Instructions     Antibiotic as prescribed.  If you worsen, go to the ER.  Take care  Dr. Lacinda Axon    ED Prescriptions    Medication Sig Dispense Auth. Provider   ciprofloxacin (CIPRO) 500 MG tablet Take 1 tablet (500 mg total) by mouth 2 (two) times daily. 20 tablet Coral Spikes, DO     PDMP not reviewed this encounter.   Coral Spikes, [place] 01/20/20 2326

## 2020-01-20 NOTE — ED Triage Notes (Signed)
Patient states that 1 week ago he got bit by an insect on right leg. Reports that he started noticing leg cramping on Tuesday and reports he saw his doctor and received muscle relaxers. Patient states that he has since started to have shortness of breath, fever, loss of taste and abdominal cramping. States that the fever was 103 today.

## 2020-01-20 NOTE — Discharge Instructions (Signed)
Antibiotic as prescribed.  If you worsen, go to the ER.  Take care  Dr. Lacinda Axon

## 2020-01-21 ENCOUNTER — Telehealth: Payer: Self-pay | Admitting: Family Medicine

## 2020-01-21 ENCOUNTER — Other Ambulatory Visit
Admission: RE | Admit: 2020-01-21 | Discharge: 2020-01-21 | Disposition: A | Discharge status: Home / Self Care | Payer: Medicare Other | Source: Ambulatory Visit | Attending: Family Medicine | Admitting: Family Medicine

## 2020-01-21 DIAGNOSIS — R300 Dysuria: Secondary | ICD-10-CM | POA: Diagnosis present

## 2020-01-21 LAB — SARS CORONAVIRUS 2 (TAT 6-24 HRS): SARS Coronavirus 2: NEGATIVE

## 2020-01-21 NOTE — Telephone Encounter (Signed)
Lab having difficultly finding urine culture order. Attempted to reorder and duplicate order notice appeard.   [facility]

## 2020-01-23 LAB — URINE CULTURE: Culture: >=100000 — AB

## 2020-03-15 ENCOUNTER — Other Ambulatory Visit: Payer: Self-pay | Admitting: Family Medicine

## 2020-04-30 ENCOUNTER — Ambulatory Visit: Admit: 2020-04-30 | Discharge: 2020-05-01 | Payer: MEDICARE

## 2020-04-30 DIAGNOSIS — L578 Other skin changes due to chronic exposure to nonionizing radiation: Principal | ICD-10-CM

## 2020-04-30 DIAGNOSIS — Z79899 Other long term (current) drug therapy: Principal | ICD-10-CM

## 2020-04-30 DIAGNOSIS — L820 Inflamed seborrheic keratosis: Principal | ICD-10-CM

## 2020-04-30 DIAGNOSIS — L111 Transient acantholytic dermatosis [Grover]: Principal | ICD-10-CM

## 2020-04-30 DIAGNOSIS — Z85828 Personal history of other malignant neoplasm of skin: Principal | ICD-10-CM

## 2020-04-30 DIAGNOSIS — L570 Actinic keratosis: Principal | ICD-10-CM

## 2020-06-28 MED ORDER — METHOTREXATE SODIUM 2.5 MG TABLET
ORAL_TABLET | 2 refills | 0.00000 days | Status: CP
Start: 2020-06-28 — End: ?

## 2020-08-06 ENCOUNTER — Ambulatory Visit: Admit: 2020-08-06 | Discharge: 2020-08-07 | Payer: MEDICARE

## 2020-08-06 DIAGNOSIS — L304 Erythema intertrigo: Principal | ICD-10-CM

## 2020-08-06 DIAGNOSIS — L989 Disorder of the skin and subcutaneous tissue, unspecified: Principal | ICD-10-CM

## 2020-08-06 DIAGNOSIS — Z85828 Personal history of other malignant neoplasm of skin: Principal | ICD-10-CM

## 2020-08-06 DIAGNOSIS — Z79899 Other long term (current) drug therapy: Principal | ICD-10-CM

## 2020-08-06 DIAGNOSIS — L111 Transient acantholytic dermatosis [Grover]: Principal | ICD-10-CM

## 2020-08-06 DIAGNOSIS — L570 Actinic keratosis: Principal | ICD-10-CM

## 2020-08-06 MED ORDER — NYSTATIN 100,000 UNIT/GRAM TOPICAL OINTMENT
Freq: Two times a day (BID) | TOPICAL | 5 refills | 0.00000 days | Status: CP
Start: 2020-08-06 — End: ?

## 2020-08-06 MED ORDER — HYDROCORTISONE 2.5 % TOPICAL OINTMENT
Freq: Two times a day (BID) | TOPICAL | 3 refills | 0.00000 days | Status: CP
Start: 2020-08-06 — End: 2021-08-06

## 2020-08-08 ENCOUNTER — Ambulatory Visit (INDEPENDENT_AMBULATORY_CARE_PROVIDER_SITE_OTHER)
Admit: 2020-08-08 | Discharge: 2020-08-08 | Disposition: A | Discharge status: Home / Self Care | Payer: Medicare PPO | Attending: Family Medicine | Admitting: Family Medicine

## 2020-08-08 ENCOUNTER — Encounter: Payer: Self-pay | Admitting: Emergency Medicine

## 2020-08-08 ENCOUNTER — Other Ambulatory Visit: Payer: Self-pay

## 2020-08-08 ENCOUNTER — Ambulatory Visit
Admission: EM | Admit: 2020-08-08 | Discharge: 2020-08-08 | Disposition: A | Discharge status: Home / Self Care | Payer: Medicare PPO | Attending: Family Medicine | Admitting: Family Medicine

## 2020-08-08 DIAGNOSIS — I700 Atherosclerosis of aorta: Secondary | ICD-10-CM | POA: Insufficient documentation

## 2020-08-08 DIAGNOSIS — R5383 Other fatigue: Secondary | ICD-10-CM | POA: Insufficient documentation

## 2020-08-08 DIAGNOSIS — Z882 Allergy status to sulfonamides status: Secondary | ICD-10-CM | POA: Diagnosis not present

## 2020-08-08 DIAGNOSIS — R531 Weakness: Secondary | ICD-10-CM | POA: Diagnosis not present

## 2020-08-08 DIAGNOSIS — R1030 Lower abdominal pain, unspecified: Secondary | ICD-10-CM | POA: Diagnosis not present

## 2020-08-08 DIAGNOSIS — Z79899 Other long term (current) drug therapy: Secondary | ICD-10-CM | POA: Diagnosis not present

## 2020-08-08 DIAGNOSIS — R197 Diarrhea, unspecified: Secondary | ICD-10-CM | POA: Insufficient documentation

## 2020-08-08 DIAGNOSIS — D72829 Elevated white blood cell count, unspecified: Secondary | ICD-10-CM | POA: Diagnosis not present

## 2020-08-08 LAB — CBC WITH DIFFERENTIAL/PLATELET
Abs Immature Granulocytes: 0.16 10*3/uL — ABNORMAL HIGH (ref 0.00–0.07)
Basophils Absolute: 0 10*3/uL (ref 0.0–0.1)
Basophils Relative: 0 %
Eosinophils Absolute: 1.8 10*3/uL — ABNORMAL HIGH (ref 0.0–0.5)
Eosinophils Relative: 8 %
HCT: 48.1 % (ref 39.0–52.0)
Hemoglobin: 15.7 g/dL (ref 13.0–17.0)
Immature Granulocytes: 1 %
Lymphocytes Relative: 9 %
Lymphs Abs: 2 10*3/uL (ref 0.7–4.0)
MCH: 32.2 pg (ref 26.0–34.0)
MCHC: 32.6 g/dL (ref 30.0–36.0)
MCV: 98.6 fL (ref 80.0–100.0)
Monocytes Absolute: 1.5 10*3/uL — ABNORMAL HIGH (ref 0.1–1.0)
Monocytes Relative: 6 %
Neutro Abs: 17.5 10*3/uL — ABNORMAL HIGH (ref 1.7–7.7)
Neutrophils Relative %: 76 %
Platelets: 265 10*3/uL (ref 150–400)
RBC: 4.88 MIL/uL (ref 4.22–5.81)
RDW: 14.4 % (ref 11.5–15.5)
WBC: 23.1 10*3/uL — ABNORMAL HIGH (ref 4.0–10.5)
nRBC: 0 % (ref 0.0–0.2)

## 2020-08-08 LAB — GASTROINTESTINAL PANEL BY PCR, STOOL (REPLACES STOOL CULTURE)

## 2020-08-08 LAB — COMPREHENSIVE METABOLIC PANEL
ALT: 14 U/L (ref 0–44)
AST: 13 U/L — ABNORMAL LOW (ref 15–41)
Albumin: 3.9 g/dL (ref 3.5–5.0)
Alkaline Phosphatase: 84 U/L (ref 38–126)
Anion gap: 12 (ref 5–15)
BUN: 20 mg/dL (ref 8–23)
CO2: 22 mmol/L (ref 22–32)
Calcium: 8.7 mg/dL — ABNORMAL LOW (ref 8.9–10.3)
Chloride: 102 mmol/L (ref 98–111)
Creatinine, Ser: 1.29 mg/dL — ABNORMAL HIGH (ref 0.61–1.24)
GFR, Estimated: 56 mL/min — ABNORMAL LOW (ref >60)
Glucose, Bld: 150 mg/dL — ABNORMAL HIGH (ref 70–99)
Potassium: 3.4 mmol/L — ABNORMAL LOW (ref 3.5–5.1)
Sodium: 136 mmol/L (ref 135–145)
Total Bilirubin: 0.6 mg/dL (ref 0.3–1.2)
Total Protein: 7.1 g/dL (ref 6.5–8.1)

## 2020-08-08 LAB — URINALYSIS, COMPLETE (UACMP) WITH MICROSCOPIC
Glucose, UA: NEGATIVE mg/dL
Hgb urine dipstick: NEGATIVE
Ketones, ur: NEGATIVE mg/dL
Leukocytes,Ua: NEGATIVE
Nitrite: NEGATIVE
Specific Gravity, Urine: >1.03 — ABNORMAL HIGH (ref 1.005–1.030)
pH: 5 (ref 5.0–8.0)

## 2020-08-08 LAB — C DIFFICILE QUICK SCREEN W PCR REFLEX
C Diff antigen: POSITIVE — AB
C Diff toxin: NEGATIVE

## 2020-08-08 LAB — CLOSTRIDIUM DIFFICILE BY PCR, REFLEXED: Toxigenic C. Difficile by PCR: NEGATIVE

## 2020-08-08 LAB — LIPASE, BLOOD: Lipase: 18 U/L (ref 11–51)

## 2020-08-08 MED ORDER — DIPHENOXYLATE-ATROPINE 2.5-0.025 MG PO TABS
1.0000 | ORAL_TABLET | Freq: Four times a day (QID) | ORAL | 0 refills | Status: DC | PRN
Start: 2020-08-08 — End: 2024-01-07

## 2020-08-08 NOTE — Discharge Instructions (Addendum)
Fluids.  Return with stool sample  Medication as prescribed.  Take care  Dr. Lacinda Axon

## 2020-08-08 NOTE — ED Provider Notes (Signed)
[facility]    CSN: [id number] Arrival date & time: 08/08/20  0831      History   Chief Complaint Chief Complaint  Patient presents with  . Diarrhea   HPI  80 year old male presents with lower abdominal pain, diarrhea.  Patient reports that he has had symptoms for the past 3 weeks.  He reports ongoing diarrhea.  He states that he feels fatigued and weak.  He reports associated diffuse lower abdominal pain.  No documented fever.  No recent antibiotic use.  No known exacerbating or relieving factors.  He does note belching and gas as well.  Mild nausea.  No vomiting.  Reports a decrease in appetite.  No other complaints or concerns at this time.  Past Medical History:  Diagnosis Date  . 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 Cancer ([place])    skin cancer  . Colon polyp 04/2015   TUBULAR ADENOMA  . GERD (gastroesophageal reflux disease)   . Grover's disease   . Hyperlipidemia   . Hypertension   . Mitral valve disorder     Patient Active Problem List   Diagnosis Date Noted  . Gastroesophageal reflux disease 11/12/2017  . Abnormal biliary HIDA scan 11/12/2017    Past Surgical History:  Procedure Laterality Date  . CARDIAC CATHETERIZATION Left 01/28/2016   Procedure: Left Heart Cath and Coronary Angiography;  Surgeon: Teodoro Spray, MD;  Location: [place] [facility] LAB;  Service: Cardiovascular;  Laterality: Left;  . COLONOSCOPY WITH PROPOFOL N/A 05/10/2015   Procedure: COLONOSCOPY WITH PROPOFOL;  Surgeon: Hulen Luster, MD;  Location: [facility] ENDOSCOPY;  Service: Gastroenterology;  Laterality: N/A;  . ESOPHAGOGASTRODUODENOSCOPY (EGD) WITH PROPOFOL N/A 05/10/2015   Procedure: ESOPHAGOGASTRODUODENOSCOPY (EGD) WITH PROPOFOL;  Surgeon: Hulen Luster, MD;  Location: [facility] ENDOSCOPY;  Service: Gastroenterology;  Laterality: N/A;  . ESOPHAGOGASTRODUODENOSCOPY (EGD) WITH PROPOFOL N/A 06/10/2015   Procedure: ESOPHAGOGASTRODUODENOSCOPY (EGD) WITH PROPOFOL;  Surgeon: Hulen Luster, MD;  Location:  [facility] ENDOSCOPY;  Service: Gastroenterology;  Laterality: N/A;  . ESOPHAGOGASTRODUODENOSCOPY (EGD) WITH PROPOFOL N/A 06/29/2016   Procedure: ESOPHAGOGASTRODUODENOSCOPY (EGD) WITH PROPOFOL;  Surgeon: Lollie Sails, MD;  Location: [facility] ENDOSCOPY;  Service: Endoscopy;  Laterality: N/A;  . SKIN CANCER EXCISION     on arms       Home Medications    Prior to Admission medications   Medication Sig Start Date End Date Taking? Authorizing Provider  atorvastatin (LIPITOR) 10 MG tablet Take 10 mg by mouth daily.   Yes [provider]  cetirizine (ZYRTEC) 10 MG tablet Take 10 mg by mouth daily.   Yes [provider]  diphenoxylate-atropine (LOMOTIL) 2.5-0.025 MG tablet Take 1 tablet by mouth 4 (four) times daily as needed for diarrhea or loose stools. 08/08/20  Yes Katie Moch G, DO  doxepin (SINEQUAN) 10 MG capsule Take 10 mg by mouth.   Yes [provider]  lisinopril-hydrochlorothiazide (PRINZIDE,ZESTORETIC) 20-25 MG tablet Take 1 tablet by mouth daily.   Yes [provider]  metFORMIN (GLUCOPHAGE) 500 MG tablet Take 500 mg by mouth daily. 11/29/19  Yes [provider]  methotrexate 2.5 MG tablet TAKE 5 TABLETS BY MOUTH ONCE A WEEK 01/01/20  Yes [provider]  oxybutynin (DITROPAN) 5 MG tablet Take 5 mg by mouth at bedtime.   Yes [provider]  pantoprazole (PROTONIX) 40 MG tablet Take 40 mg by mouth daily.   Yes [provider]  triamcinolone cream (KENALOG) 0.1 % Apply 1 application topically as needed (for rashy areas).  Yes [provider]  valACYclovir (VALTREX) 1000 MG tablet Take 1,000 mg by mouth 2 (two) times daily.   Yes [provider]  zolpidem (AMBIEN) 10 MG tablet Take 10 mg by mouth at bedtime as needed for sleep.   Yes [provider]    Family History Family History  Problem Relation Age of Onset  . Colon cancer Neg Hx     Social History Social History   Tobacco Use  .  Smoking status: Never Smoker  . Smokeless tobacco: Never Used  Vaping Use  . Vaping Use: Never used  Substance Use Topics  . Alcohol use: No  . Drug use: No     Allergies   Latex and Sulfa antibiotics   Review of Systems Review of Systems Per HPI  Physical Exam Triage Vital Signs ED Triage Vitals [08/08/20 0935]  Enc Vitals Group     BP 136/82     Pulse Rate 93     Resp 18     Temp 98 F (36.7 C)     Temp Source Oral     SpO2 98 %     Weight 235 lb 0.2 oz (106.6 kg)     Height 6\' 1"  (1.854 m)     Head Circumference      Peak Flow      Pain Score 6     Pain Loc      Pain Edu?      Excl. in [place]?    Updated Vital Signs BP 136/82 (BP Location: Left Arm)   Pulse 93   Temp 98 F (36.7 C) (Oral)   Resp 18   Ht 6\' 1"  (1.854 m)   Wt 106.6 kg   SpO2 98%   BMI 31.01 kg/m   Visual Acuity Right Eye Distance:   Left Eye Distance:   Bilateral Distance:    Right Eye Near:   Left Eye Near:    Bilateral Near:     Physical Exam Constitutional:      General: He is not in acute distress.    Appearance: Normal appearance. He is not ill-appearing.  HENT:     Head: Normocephalic and atraumatic.  Eyes:     General:        Right eye: No discharge.        Left eye: No discharge.     Conjunctiva/sclera: Conjunctivae normal.  Cardiovascular:     Rate and Rhythm: Normal rate and regular rhythm.  Pulmonary:     Effort: Pulmonary effort is normal.     Breath sounds: Normal breath sounds. No wheezing, rhonchi or rales.  Abdominal:     General: There is no distension.     Palpations: Abdomen is soft.     Comments: No significant tenderness on exam.  Neurological:     Mental Status: He is alert.  Psychiatric:        Mood and Affect: Mood normal.        Behavior: Behavior normal.    UC Treatments / Results  Labs (all labs ordered are listed, but only abnormal results are displayed) Labs Reviewed  CBC WITH DIFFERENTIAL/PLATELET - Abnormal; Notable for the following  components:      Result Value   WBC 23.1 (*)    Neutro Abs 17.5 (*)    Monocytes Absolute 1.5 (*)    Eosinophils Absolute 1.8 (*)    Abs Immature Granulocytes 0.16 (*)    All other components within normal limits  COMPREHENSIVE  METABOLIC PANEL - Abnormal; Notable for the following components:   Potassium 3.4 (*)    Glucose, Bld 150 (*)    Creatinine, Ser 1.29 (*)    Calcium 8.7 (*)    AST 13 (*)    GFR, Estimated 56 (*)    All other components within normal limits  URINALYSIS, COMPLETE (UACMP) WITH MICROSCOPIC - Abnormal; Notable for the following components:   Specific Gravity, Urine >1.030 (*)    Bilirubin Urine SMALL (*)    Protein, ur TRACE (*)    Bacteria, UA RARE (*)    All other components within normal limits  LIPASE, BLOOD  PATHOLOGIST SMEAR REVIEW    EKG   Radiology CT ABDOMEN PELVIS WO CONTRAST  Result Date: 08/08/2020 CLINICAL DATA:  Diarrhea, lower abdominal cramping EXAM: CT ABDOMEN AND PELVIS WITHOUT CONTRAST TECHNIQUE: Multidetector CT imaging of the abdomen and pelvis was performed following the standard protocol without IV contrast. COMPARISON:  01/20/2020 FINDINGS: Lower chest: Scarring at the left base.  No acute abnormality. Hepatobiliary: No focal hepatic abnormality. Gallbladder unremarkable. Pancreas: No focal abnormality or ductal dilatation. Spleen: No focal abnormality.  Normal size. Adrenals/Urinary Tract: No adrenal abnormality. No focal renal abnormality. No stones or hydronephrosis. Urinary bladder is unremarkable. Stomach/Bowel: Stomach, large and small bowel grossly unremarkable. Appendix normal. Vascular/Lymphatic: Aortic atherosclerosis. No evidence of aneurysm or adenopathy. Reproductive: No visible focal abnormality. Other: No free fluid or free air. Musculoskeletal: No acute bony abnormality. Degenerative changes in the lumbar spine. IMPRESSION: No acute findings in the abdomen or pelvis. Aortic atherosclerosis. Electronically Signed   By: Rolm Baptise M.D.   On: 08/08/2020 11:18    Procedures Procedures (including critical care time)  Medications Ordered in UC Medications - No data to display  Initial Impression / Assessment and Plan / UC Course  I have reviewed the triage vital signs and the nursing notes.  Pertinent labs & imaging results that were available during my care of the patient were reviewed by me and considered in my medical decision making (see chart for details).    80 year old male presents with diarrhea, fatigue and lower abdominal pain. Labs notable for leukocytosis with left shift.  White count is 23.1.  Mildly elevated creatinine, slightly above baseline.  Urinalysis not consistent with UTI.  CT abdomen pelvis was obtained and was unremarkable.  Sending peripheral smear.  Patient to return with stool sample for testing. Lomotil as needed.   Final Clinical Impressions(s) / UC Diagnoses   Final diagnoses:  Lower abdominal pain  Diarrhea, unspecified type  Leukocytosis, unspecified type     Discharge Instructions     Fluids.  Return with stool sample  Medication as prescribed.  Take care  Dr. Lacinda Axon    ED Prescriptions    Medication Sig Dispense Auth. Provider   diphenoxylate-atropine (LOMOTIL) 2.5-0.025 MG tablet Take 1 tablet by mouth 4 (four) times daily as needed for diarrhea or loose stools. 30 tablet Coral Spikes, DO     PDMP not reviewed this encounter.   Coral Spikes, [place] 08/08/20 1258

## 2020-08-08 NOTE — ED Notes (Signed)
Patient has been approved for CPT [id number]. Obtained via [facility]  . Valid for 08/08/2020  through 09/07/2020. Authorization # [id number].

## 2020-08-08 NOTE — ED Triage Notes (Signed)
Patient c/o diarrhea that started a few weeks ago. He states he has been having cramping in his lower abdomen and when he has to go to the bathroom he is unable to make it.

## 2020-08-09 ENCOUNTER — Ambulatory Visit
Admission: EM | Admit: 2020-08-09 | Discharge: 2020-08-09 | Disposition: A | Discharge status: Home / Self Care | Payer: Medicare PPO | Attending: Internal Medicine | Admitting: Internal Medicine

## 2020-08-09 ENCOUNTER — Encounter: Payer: Self-pay | Admitting: Emergency Medicine

## 2020-08-09 ENCOUNTER — Other Ambulatory Visit: Payer: Self-pay

## 2020-08-09 DIAGNOSIS — R197 Diarrhea, unspecified: Secondary | ICD-10-CM | POA: Insufficient documentation

## 2020-08-09 LAB — CBC WITH DIFFERENTIAL/PLATELET
Abs Immature Granulocytes: 0 10*3/uL (ref 0.00–0.07)
Basophils Absolute: 0 10*3/uL (ref 0.0–0.1)
Basophils Relative: 0 %
Eosinophils Absolute: 2.2 10*3/uL — ABNORMAL HIGH (ref 0.0–0.5)
Eosinophils Relative: 14 %
HCT: 46 % (ref 39.0–52.0)
Hemoglobin: 15 g/dL (ref 13.0–17.0)
Lymphocytes Relative: 21 %
Lymphs Abs: 3.3 10*3/uL (ref 0.7–4.0)
MCH: 32.6 pg (ref 26.0–34.0)
MCHC: 32.6 g/dL (ref 30.0–36.0)
MCV: 100 fL (ref 80.0–100.0)
Monocytes Absolute: 1.2 10*3/uL — ABNORMAL HIGH (ref 0.1–1.0)
Monocytes Relative: 8 %
Neutro Abs: 8.9 10*3/uL — ABNORMAL HIGH (ref 1.7–7.7)
Neutrophils Relative %: 57 %
Platelets: 250 10*3/uL (ref 150–400)
RBC Morphology: NONE SEEN
RBC: 4.6 MIL/uL (ref 4.22–5.81)
RDW: 14.3 % (ref 11.5–15.5)
WBC: 15.6 10*3/uL — ABNORMAL HIGH (ref 4.0–10.5)
nRBC: 0 % (ref 0.0–0.2)

## 2020-08-09 LAB — PATHOLOGIST SMEAR REVIEW

## 2020-08-09 MED ORDER — SODIUM CHLORIDE 0.9 % IV BOLUS
1000.0000 mL | Freq: Once | INTRAVENOUS | Status: AC
  Administered 2020-08-09: 1000 mL via INTRAVENOUS

## 2020-08-09 MED ORDER — LACTINEX PO CHEW
1.0000 | CHEWABLE_TABLET | Freq: Three times a day (TID) | ORAL | 0 refills | Status: AC
Start: 2020-08-09 — End: 2020-09-08

## 2020-08-09 NOTE — Discharge Instructions (Signed)
Increase oral fluid intake Buy probiotics over-the-counter and start taking it Imodium as needed for diarrhea If symptoms persist you may need to see a gastroenterologist for further evaluation.

## 2020-08-09 NOTE — ED Provider Notes (Signed)
[facility]    CSN: [id number] Arrival date & time: 08/09/20  1003      History   Chief Complaint Chief Complaint  Patient presents with  . Diarrhea    HPI Steve Rhodes. is a 80 y.o. male here for follow-up on diarrhea and abdominal pain.  Patient was seen yesterday and had extensive work-up done for 3 weeks of nonbloody nonmucoid diarrhea.  CT scan of the abdomen was negative for acute intra-abdominal process.  C. difficile antigen was positive but toxin was negative.  No recent exposure to antibiotics.  Patient is on methotrexate for Grovers disease.  Oral intake is fair and appetite has been maintained.  Patient has some dietary sensitivity to lactose and has drinks only lactose-free milk.   He has had 1 large bowel movement today.  Bowel movement was loose with no blood or mucus.  CBC showed a WBC of 23,000 and hence the return visit to the urgent care.  HPI  Past Medical History:  Diagnosis Date  . 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 Cancer ([place])    skin cancer  . Colon polyp 04/2015   TUBULAR ADENOMA  . GERD (gastroesophageal reflux disease)   . Grover's disease   . Hyperlipidemia   . Hypertension   . Mitral valve disorder     Patient Active Problem List   Diagnosis Date Noted  . Gastroesophageal reflux disease 11/12/2017  . Abnormal biliary HIDA scan 11/12/2017    Past Surgical History:  Procedure Laterality Date  . CARDIAC CATHETERIZATION Left 01/28/2016   Procedure: Left Heart Cath and Coronary Angiography;  Surgeon: Teodoro Spray, MD;  Location: [place] [facility];  Service: Cardiovascular;  Laterality: Left;  . COLONOSCOPY WITH PROPOFOL N/A 05/10/2015   Procedure: COLONOSCOPY WITH PROPOFOL;  Surgeon: Hulen Luster, MD;  Location: [facility] ENDOSCOPY;  Service: Gastroenterology;  Laterality: N/A;  . ESOPHAGOGASTRODUODENOSCOPY (EGD) WITH PROPOFOL N/A 05/10/2015   Procedure: ESOPHAGOGASTRODUODENOSCOPY (EGD) WITH PROPOFOL;  Surgeon: Hulen Luster, MD;  Location:  [facility] ENDOSCOPY;  Service: Gastroenterology;  Laterality: N/A;  . ESOPHAGOGASTRODUODENOSCOPY (EGD) WITH PROPOFOL N/A 06/10/2015   Procedure: ESOPHAGOGASTRODUODENOSCOPY (EGD) WITH PROPOFOL;  Surgeon: Hulen Luster, MD;  Location: [facility] ENDOSCOPY;  Service: Gastroenterology;  Laterality: N/A;  . ESOPHAGOGASTRODUODENOSCOPY (EGD) WITH PROPOFOL N/A 06/29/2016   Procedure: ESOPHAGOGASTRODUODENOSCOPY (EGD) WITH PROPOFOL;  Surgeon: Lollie Sails, MD;  Location: [facility] ENDOSCOPY;  Service: Endoscopy;  Laterality: N/A;  . SKIN CANCER EXCISION     on arms       Home Medications    Prior to Admission medications   Medication Sig Start Date End Date Taking? Authorizing Provider  atorvastatin (LIPITOR) 10 MG tablet Take 10 mg by mouth daily.   Yes [provider]  cetirizine (ZYRTEC) 10 MG tablet Take 10 mg by mouth daily.   Yes [provider]  diphenoxylate-atropine (LOMOTIL) 2.5-0.025 MG tablet Take 1 tablet by mouth 4 (four) times daily as needed for diarrhea or loose stools. 08/08/20  Yes Cook, Jayce G, DO  doxepin (SINEQUAN) 10 MG capsule Take 10 mg by mouth.   Yes [provider]  lactobacillus acidophilus & bulgar (LACTINEX) chewable tablet Chew 1 tablet by mouth 3 (three) times daily with meals. 08/09/20 09/08/20 Yes Oley Lahaie, Myrene Galas, MD  lisinopril-hydrochlorothiazide (PRINZIDE,ZESTORETIC) 20-25 MG tablet Take 1 tablet by mouth daily.   Yes [provider]  metFORMIN (GLUCOPHAGE) 500 MG tablet Take 500 mg by mouth daily. 11/29/19  Yes [provider]  methotrexate  2.5 MG tablet TAKE 5 TABLETS BY MOUTH ONCE A WEEK 01/01/20  Yes [provider]  oxybutynin (DITROPAN) 5 MG tablet Take 5 mg by mouth at bedtime.   Yes [provider]  pantoprazole (PROTONIX) 40 MG tablet Take 40 mg by mouth daily.   Yes [provider]  triamcinolone cream (KENALOG) 0.1 % Apply 1 application topically as needed (for rashy areas).   Yes [provider]  valACYclovir (VALTREX) 1000 MG tablet Take 1,000 mg by mouth 2 (two) times daily.   Yes [provider]  zolpidem (AMBIEN) 10 MG tablet Take 10 mg by mouth at bedtime as needed for sleep.   Yes [provider]    Family History Family History  Problem Relation Age of Onset  . Colon cancer Neg Hx     Social History Social History   Tobacco Use  . Smoking status: Never Smoker  . Smokeless tobacco: Never Used  Vaping Use  . Vaping Use: Never used  Substance Use Topics  . Alcohol use: No  . Drug use: No     Allergies   Latex and Sulfa antibiotics   Review of Systems Review of Systems  Constitutional: Negative for chills, fatigue and fever.  Gastrointestinal: Positive for abdominal pain and diarrhea. Negative for abdominal distention, blood in stool and vomiting.  Genitourinary: Negative.   Musculoskeletal: Negative.   Neurological: Negative for dizziness and light-headedness.     Physical Exam Triage Vital Signs ED Triage Vitals  Enc Vitals Group     BP 08/09/20 1059 115/65     Pulse Rate 08/09/20 1059 65     Resp 08/09/20 1059 18     Temp 08/09/20 1059 98.4 F (36.9 C)     Temp Source 08/09/20 1059 Oral     SpO2 08/09/20 1059 98 %     Weight 08/09/20 1057 235 lb 0.2 oz (106.6 kg)     Height 08/09/20 1057 6\' 1"  (1.854 m)     Head Circumference --      Peak Flow --      Pain Score 08/09/20 1057 6     Pain Loc --      Pain Edu? --      Excl. in [place]? --    No data found.  Updated Vital Signs BP 115/65 (BP Location: Left Arm)   Pulse 65   Temp 98.4 F (36.9 C) (Oral)   Resp 18   Ht 6\' 1"  (1.854 m)   Wt 106.6 kg   SpO2 98%   BMI 31.01 kg/m   Visual Acuity Right Eye Distance:   Left Eye Distance:   Bilateral Distance:    Right Eye Near:   Left Eye Near:    Bilateral Near:     Physical Exam Vitals and nursing note reviewed.  Constitutional:      General: He is not in acute distress.    Appearance: He is not  ill-appearing.  Cardiovascular:     Rate and Rhythm: Normal rate and regular rhythm.     Pulses: Normal pulses.     Heart sounds: Normal heart sounds.  Pulmonary:     Effort: Pulmonary effort is normal.     Breath sounds: Normal breath sounds.  Abdominal:     General: Bowel sounds are normal.     Palpations: Abdomen is soft.  Musculoskeletal:        General: Normal range of motion.     Cervical back: Normal range of motion.  Neurological:     General: No focal deficit present.     Mental Status: He is alert and oriented to person, place, and time.      UC Treatments / Results  Labs (all labs ordered are listed, but only abnormal results are displayed) Labs Reviewed  CBC WITH DIFFERENTIAL/PLATELET    EKG   Radiology CT ABDOMEN PELVIS WO CONTRAST  Result Date: 08/08/2020 CLINICAL DATA:  Diarrhea, lower abdominal cramping EXAM: CT ABDOMEN AND PELVIS WITHOUT CONTRAST TECHNIQUE: Multidetector CT imaging of the abdomen and pelvis was performed following the standard protocol without IV contrast. COMPARISON:  01/20/2020 FINDINGS: Lower chest: Scarring at the left base.  No acute abnormality. Hepatobiliary: No focal hepatic abnormality. Gallbladder unremarkable. Pancreas: No focal abnormality or ductal dilatation. Spleen: No focal abnormality.  Normal size. Adrenals/Urinary Tract: No adrenal abnormality. No focal renal abnormality. No stones or hydronephrosis. Urinary bladder is unremarkable. Stomach/Bowel: Stomach, large and small bowel grossly unremarkable. Appendix normal. Vascular/Lymphatic: Aortic atherosclerosis. No evidence of aneurysm or adenopathy. Reproductive: No visible focal abnormality. Other: No free fluid or free air. Musculoskeletal: No acute bony abnormality. Degenerative changes in the lumbar spine. IMPRESSION: No acute findings in the abdomen or pelvis. Aortic atherosclerosis. Electronically Signed   By: Rolm Baptise M.D.   On: 08/08/2020 11:18     Procedures Procedures (including critical care time)  Medications Ordered in UC Medications  sodium chloride 0.9 % bolus 1,000 mL (1,000 mLs Intravenous New Bag/Given 08/09/20 1128)    Initial Impression / Assessment and Plan / UC Course  I have reviewed the triage vital signs and the nursing notes.  Pertinent labs & imaging results that were available during my care of the patient were reviewed by me and considered in my medical decision making (see chart for details).     1.  Persistent diarrhea: 1 L IV fluids given CBC done Imodium as needed for diarrhea Probiotics over-the-counter If symptoms worsen patient is advised to return to urgent care ED return precautions given. Final Clinical Impressions(s) / UC Diagnoses   Final diagnoses:  Diarrhea, unspecified type     Discharge Instructions     Increase oral fluid intake Buy probiotics over-the-counter and start taking it Imodium as needed for diarrhea If symptoms persist you may need to see a gastroenterologist for further evaluation.   ED Prescriptions    Medication Sig Dispense Auth. Provider   lactobacillus acidophilus & bulgar (LACTINEX) chewable tablet Chew 1 tablet by mouth 3 (three) times daily with meals. 90 tablet Adel Neyer, Myrene Galas, MD     PDMP not reviewed this encounter.   Chase Picket, MD 08/09/20 [phone]

## 2020-08-09 NOTE — ED Triage Notes (Signed)
Pt presents to [facility] for follow up of diarrhea and abdominal pain. Pt states he is feel;ing about the same as yesterday.

## 2020-09-24 DIAGNOSIS — B001 Herpesviral vesicular dermatitis: Principal | ICD-10-CM

## 2020-09-24 IMAGING — CT CT ABD-PELV W/O CM
1 of 2 series · 14 of 32 positions shown, 18 images · non-contrast
Comparison: None.

CLINICAL DATA: Fever and left-sided abdominal pain.

EXAM:
CT ABDOMEN AND PELVIS WITHOUT CONTRAST
TECHNIQUE: Multidetector CT imaging of the abdomen and pelvis was performed
following the standard protocol without IV contrast.

[Series 2: axial st · axial · 0.80mm/px · z∈[-1064,-624]mm · 14 of 97 slices shown, 18 images]
[im 5/97  soft-tissue]
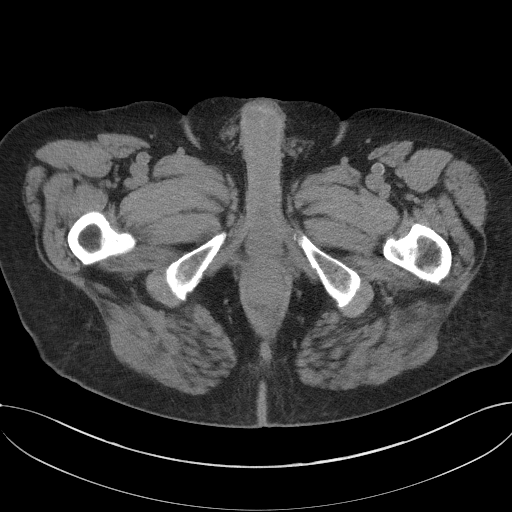
[im 5/97  bone]
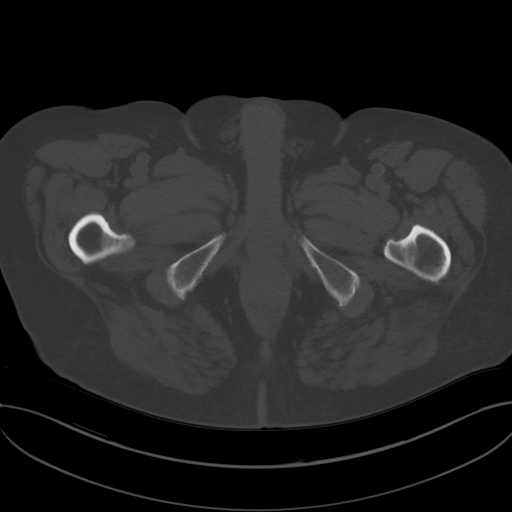
[im 13/97  soft-tissue]
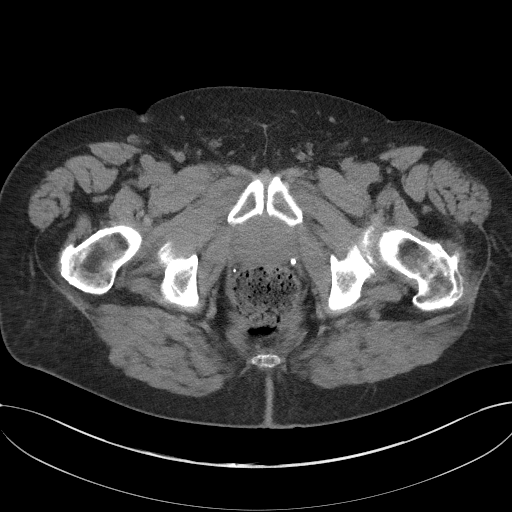
[im 21/97  soft-tissue]
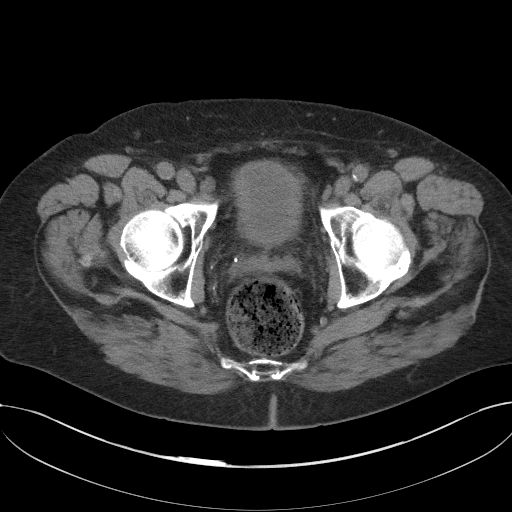
[im 29/97  soft-tissue]
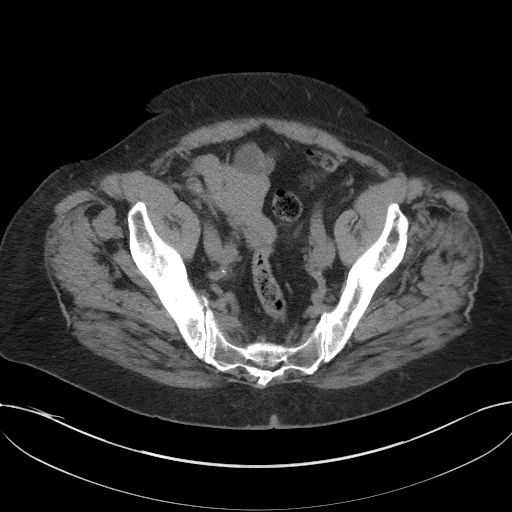
[im 37/97  soft-tissue]
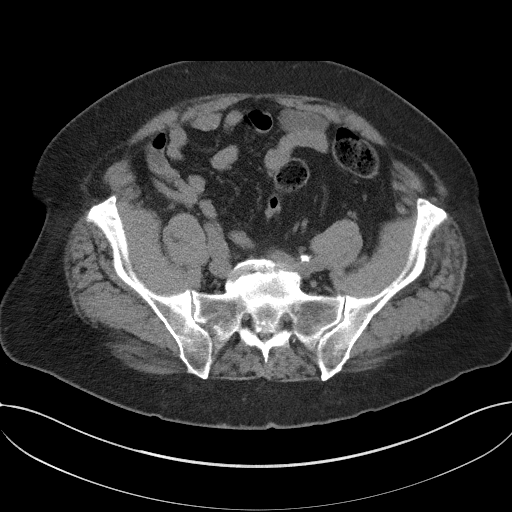
[im 45/97  soft-tissue]
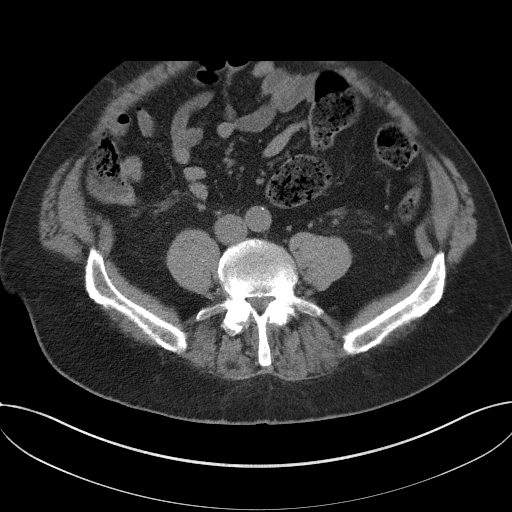
[im 53/97  soft-tissue]
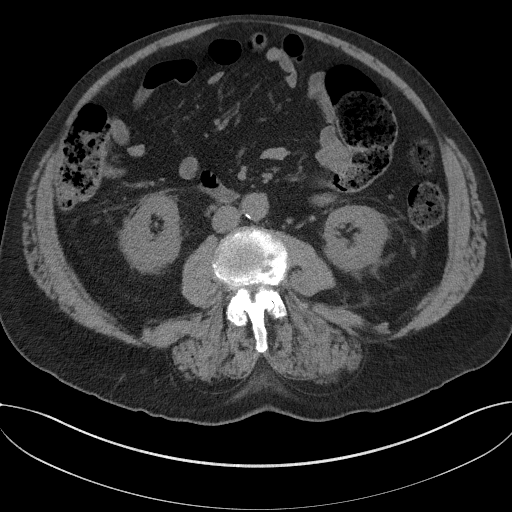
[im 61/97  soft-tissue]
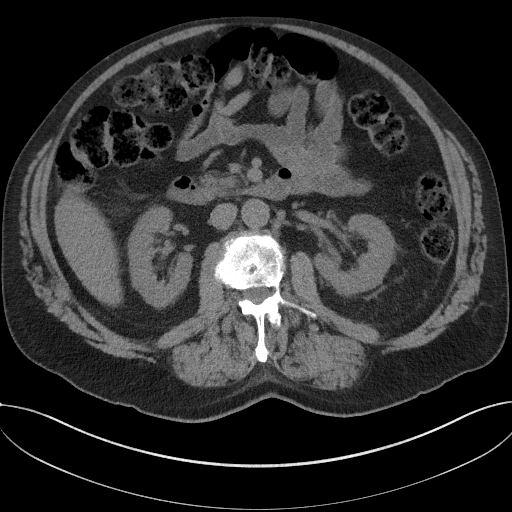
[im 69/97  soft-tissue]
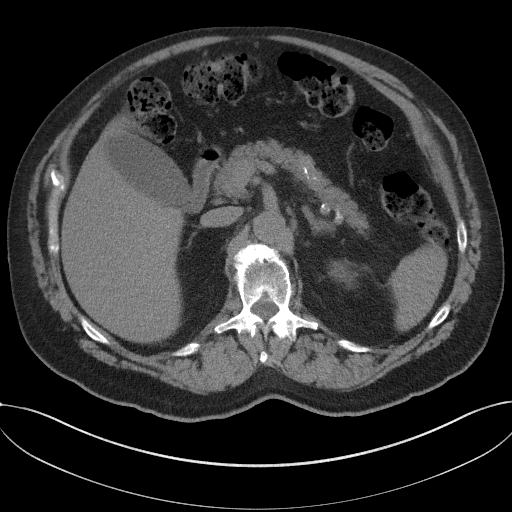
[im 69/97  bone]
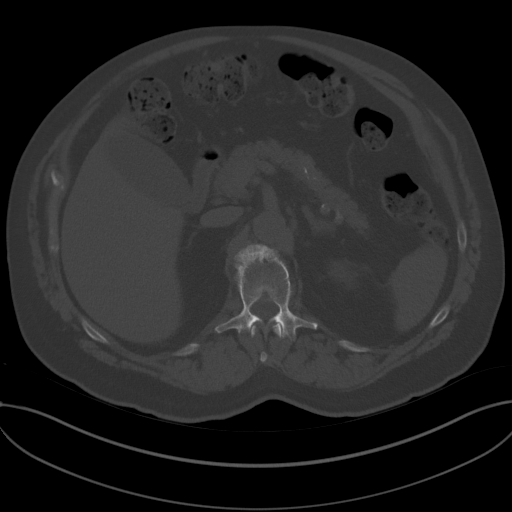
[im 77/97  soft-tissue]
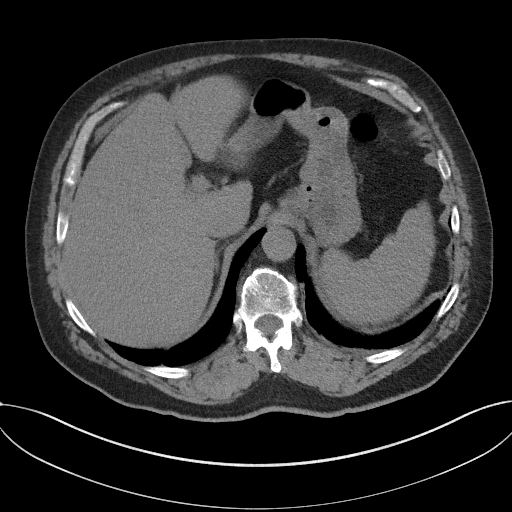
[im 81/97  lung]
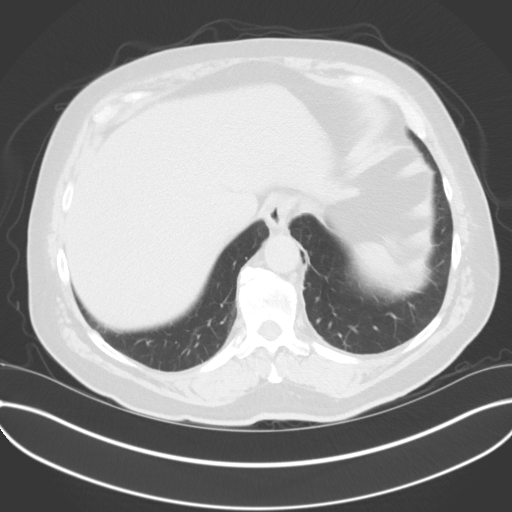
[im 85/97  soft-tissue]
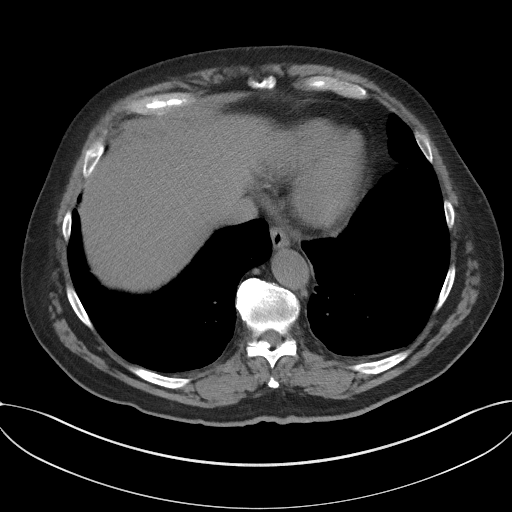
[im 85/97  lung]
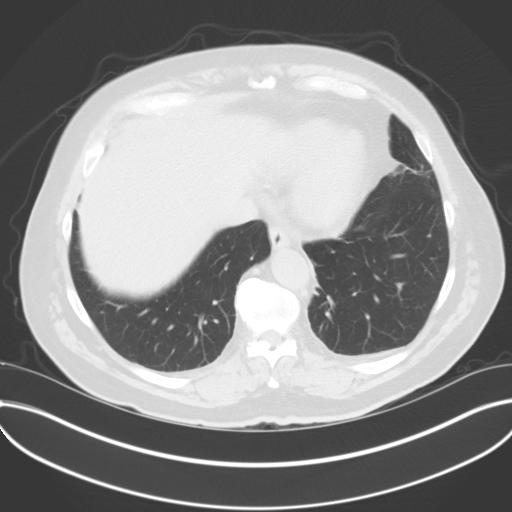
[im 89/97  lung]
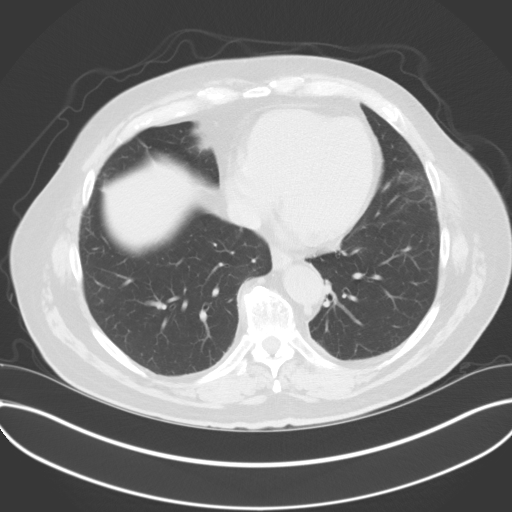
[im 93/97  soft-tissue]
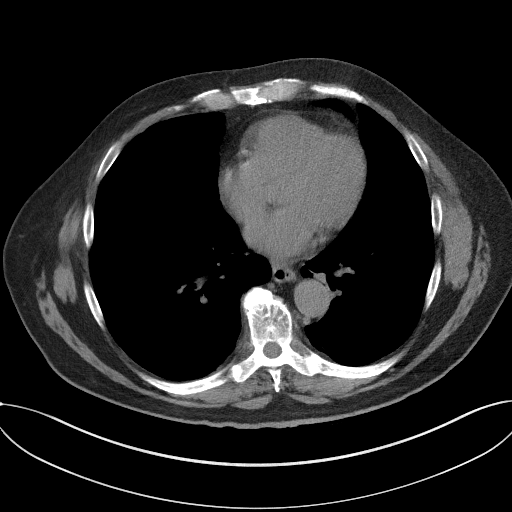
[im 93/97  lung]
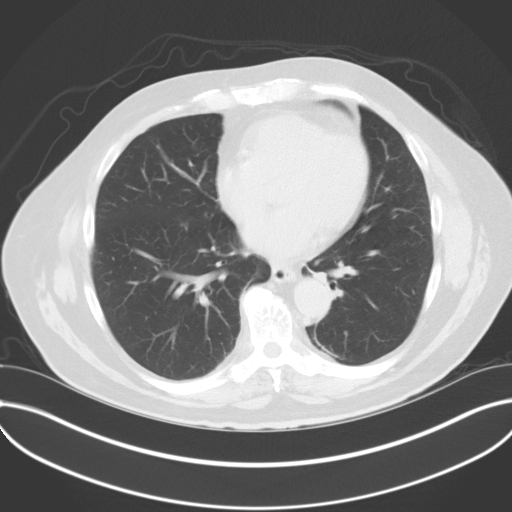

[14 of 32 positions shown; findings below may reference images not displayed]

FINDINGS: Lower chest: Minor basilar atelectasis. No pleural fluid or
confluent airspace disease.

Hepatobiliary: Decreased hepatic density consistent with steatosis.
Mild gallbladder distention without calcified gallstone or
pericholecystic inflammation. No biliary dilatation.

Pancreas: No ductal dilatation or inflammation.

Spleen: Normal in size without focal abnormality.

Adrenals/Urinary Tract: No adrenal nodule. There is bilateral
symmetric perinephric edema. No hydronephrosis. No renal or ureteral
calculi. Urinary bladder is nondistended but thick walled. Mild
perivesicular fat stranding.

Stomach/Bowel: Tiny hiatal hernia. Stomach is unremarkable. There is
no small bowel obstruction or evidence of inflammation. Appendix is
not well-defined, there is no evidence of appendicitis. Moderate
volume of stool throughout the entire colon. There is sigmoid
colonic tortuosity. No colonic wall thickening or pericolonic edema.
No significant diverticular change. Moderate stool distends the
rectum.

Vascular/Lymphatic: Mild aorto bi-iliac atherosclerosis. No aortic
aneurysm. Multiple small retroperitoneal and upper abdominal lymph
nodes are not enlarged by size criteria.

Reproductive: Enlarged spanning 5.8 cm. Periprosthetic fat is
indistinct. No focal fluid collection.

Other: No free air or ascites. Mild broad-based laxity of the
anterior abdominal wall musculature without frank abdominal wall
hernia.

Musculoskeletal: Degenerative disc disease and facet hypertrophy in
the lumbar spine. There are no acute or suspicious osseous
abnormalities.
IMPRESSION: 1. Urinary bladder wall thickening with perivesicular fat stranding,
suspicious for cystitis. Recommend correlation with urinalysis.
There is also symmetric perinephric edema which is of indeterminate
acuity, may be chronic or seen in the setting of pyelonephritis. No
urolithiasis.
2. Enlarged prostate gland with indistinct periprosthetic fat.
Prostatitis is considered.
3. Hepatic steatosis.
4. Moderate volume of colonic stool with stool distending the
rectum, query constipation.

Aortic Atherosclerosis ([id number]).

## 2020-09-24 MED ORDER — VALACYCLOVIR 500 MG TABLET
ORAL_TABLET | ORAL | 5 refills | 0.00000 days | Status: CP
Start: 2020-09-24 — End: ?

## 2020-10-18 MED ORDER — METHOTREXATE SODIUM 2.5 MG TABLET
ORAL_TABLET | 0 refills | 0.00000 days | Status: CP
Start: 2020-10-18 — End: ?

## 2020-11-05 ENCOUNTER — Ambulatory Visit: Admit: 2020-11-05 | Discharge: 2020-11-06 | Payer: MEDICARE

## 2020-11-05 DIAGNOSIS — Z79899 Other long term (current) drug therapy: Principal | ICD-10-CM

## 2020-12-13 MED ORDER — METHOTREXATE SODIUM 2.5 MG TABLET
ORAL_TABLET | 2 refills | 0.00000 days | Status: CP
Start: 2020-12-13 — End: ?

## 2020-12-24 DIAGNOSIS — L111 Transient acantholytic dermatosis [Grover]: Principal | ICD-10-CM

## 2020-12-24 MED ORDER — FOLIC ACID 1 MG TABLET
ORAL_TABLET | 5 refills | 0.00000 days | Status: CP
Start: 2020-12-24 — End: ?

## 2021-04-08 MED ORDER — METHOTREXATE SODIUM 2.5 MG TABLET
ORAL_TABLET | 2 refills | 0.00000 days | Status: CP
Start: 2021-04-08 — End: ?

## 2021-06-10 ENCOUNTER — Ambulatory Visit: Admit: 2021-06-10 | Discharge: 2021-06-11 | Payer: MEDICARE

## 2021-06-10 MED ORDER — VALACYCLOVIR 500 MG TABLET
ORAL_TABLET | ORAL | 5 refills | 0.00000 days | Status: CP
Start: 2021-06-10 — End: ?

## 2021-06-10 MED ORDER — METHOTREXATE SODIUM 2.5 MG TABLET
ORAL_TABLET | 2 refills | 0.00000 days | Status: CP
Start: 2021-06-10 — End: ?

## 2021-06-10 MED ORDER — FOLIC ACID 1 MG TABLET
ORAL_TABLET | 5 refills | 0.00000 days | Status: CP
Start: 2021-06-10 — End: ?

## 2021-06-10 MED ORDER — FLUOROURACIL 5 % TOPICAL CREAM
3 refills | 0.00000 days | Status: CP
Start: 2021-06-10 — End: ?

## 2021-06-10 MED ORDER — DUPILUMAB 300 MG/2 ML SUBCUTANEOUS PEN INJECTOR
SUBCUTANEOUS | 0 refills | 0.00000 days | Status: CP
Start: 2021-06-10 — End: ?

## 2021-06-11 DIAGNOSIS — L2081 Atopic neurodermatitis: Principal | ICD-10-CM

## 2021-08-03 MED ORDER — METHOTREXATE SODIUM 2.5 MG TABLET
ORAL_TABLET | 2 refills | 0.00000 days | Status: CP
Start: 2021-08-03 — End: ?

## 2021-08-06 NOTE — Unmapped (Signed)
Hello Dr Archie Balboa!  Fax received from [facility] pharmacy for clarification to change manufacturer, please advice.

## 2021-08-06 NOTE — Unmapped (Signed)
[facility] Specialty Medication Onboarding    Specialty Medication: Dupixent pen 300mg /57ml  Prior Authorization: Approved   Financial Assistance: No - MAPs has reached maximum number of attempts to obtain necessary information from the patient in regards to the financial assistance process  Final Copay/Day Supply: $1266.89 / 14 (loading)                                           $1266.89 / 28 (maintenance)    Insurance Restrictions: None     Notes to Pharmacist:     The triage team has completed the benefits investigation and has determined that the patient is able to fill this medication at [facility]. Please contact the patient to complete the onboarding or follow up with the prescribing physician as needed.

## 2021-09-03 NOTE — Unmapped (Signed)
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  - Increase methotrexate to 15mg  weekly and 3 mg folic acid on non-methotrexate days.  - Continue doxepin elixir nightly     (2) High Risk Medication use - methotrexate:    - CBC w/ diff and LFTs today     (3) Neoplasm Unsp x 3 (A. L-temple - punch biopsy, B. Nasal dorsum, C. R-upper back):     For lesion A:    Risks, benefits and adverse effects of biopsy were reviewed.  After obtaining verbal consent, the site was anesthetized using 1% lidocaine with epinephrine.  Tissue sample was obtained using a 4 mm  punch tool.  The defect was closed using 1  x 4-0 nylon suture and covered with Vaseline and a bandaid.  Post-procedure wound care instructions were reviewed.      Patient with be contacted with results once available.     For lesions B and C    Risks, benefits and adverse effects of biopsy were reviewed.  After obtaining verbal consent, the biopsy sites were anesthetized using 1% lidocaine with epinephrine.  Tissue sites were obtained using a dermblade and hemostasis was achieved using drysol.  The sites were covered with vaselline and a bandaid. Post-procedure wound care instructions were reviewed.      Patient will be contacted with results once available and definitive treatment will be arranged if needed.       (4) History of NMSC: chronic, stable     ?? No evidence of recurrence  ?? The importance of regular skin checks and sun protection was reviewed  ?? If pt experiences any tenderness at site or any regrowth/nodularity, he/she should contact our office for further evaluation    (5) Seborrheic Keratoses:    I counseled the patient regarding the following:    ?? Seborrheic keratoses are benign and no treatment is necessary. Lesions can be warty, smooth, flat or raised. Patients can get more of these lesions as they age.   ?? If lesions change and become enlarged, tender, burn or change colors, pt was instructed to contact us for further evaluation.     (6) Benign appearing lentigines and actinic skin damage:    ?? Pt was reassured.  No concerning lesions warranting biopsy  ?? The importance of sun protection and using OTC broad spectrum, SPF 30 or higher sunscreen was reviewed.     The patient was advised to call for an appointment should any new, changing ,or symptomatic lesion develop.     RTC: 3 months or sooner as needed  _____________________________________________________________________________________    Referring Provider: Dortha Kern, MD     Chief Complaint     Chief Complaint   Patient presents with   ??? Skin Check     Area on back bleeding x 2weeks, neck, left eye and nose         HPI     Randy Lindsey is a pleasant 81 y.o. male, who presents as a returning patient (last seen 06/10/2021) to [facility] for follow up of skin lesions and chronic atopic neurodermatitis.     Skin Lesions  Location: (1) R-temple, (2) nasal dorsum, (3) R-upper back   Duration: weeks  Character: tender and growing  Treatment(s): none   Aggravating factor(s): --  Alleviating factor(s): --    The patient denies any other new or changing  lesions or areas of concern.     Pertinent Past Medical History     Hx of NMSC:  ??? 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 sBCC of R-arm s/p ED&C 1/16  ??? 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 iSCC of L-buccal cheek s/p excision 7/16  ??? iSCC of L-temple s/p excision 7/16  ??? SCCis of R-neck s/p ED&C 7/16  ??? BCC on R-temple s/p excision 07/22/15  ??? iSCC on the L-dorsal hand s/p curettage and IL5FU 07/22/15  ??? nBCC on R-forehead s/p excision 9/17  ??? iSCC of R-elbow s/p curettage 1/20  ??? 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 SCC of R elbow s/p curettage 1/20  ??? BCC of L forehead s/p Mohs 11/2018  ??? KA of right hand s/p curettage 08/2018  ??? KA-like SCC of the R-ear s/p Mohs 9/20, [place]  ??? BCC of right temple s/p iPDT 09/2019, 2/22    Family History:   Negative for melanoma.    Past Medical History, Family History, Social History , Med List, Allergies, Problem List reviewed in the rooming section of Epic     ROS: Other than symptoms mentioned in the HPI,   No fevers, chills, or other skin complaints.    Physical Examination     Physical Exam: Patient is a well developed caucasian male in no apparent distress.  Alert and oriented x 3.  Skin: Examination of the patient's head, neck, chest, abdomen, back, buttocks, bilateral upper and lower extremities was performed and all areas not specifically commented on were within normal limits:  -- scattered tan macules, rhytids and speckled pigment change c/w previous sun exposure   -- several scattered stuck-on tan macules and papules over trunk and extremities   -- well-healed scars at sites of previous excisions/biopsies   -- a few excoriations on trunk and extremities    -- concerning areas as shown below (see dermpath order for details on site and size):           All areas not commented on are within normal limits.

## 2021-09-04 ENCOUNTER — Ambulatory Visit: Admit: 2021-09-04 | Discharge: 2021-09-04 | Payer: MEDICARE

## 2021-09-04 ENCOUNTER — Other Ambulatory Visit: Admit: 2021-09-04 | Discharge: 2021-09-04 | Payer: MEDICARE

## 2021-09-04 DIAGNOSIS — L2081 Atopic neurodermatitis: Principal | ICD-10-CM

## 2021-09-04 DIAGNOSIS — L821 Other seborrheic keratosis: Principal | ICD-10-CM

## 2021-09-04 DIAGNOSIS — Z79899 Other long term (current) drug therapy: Principal | ICD-10-CM

## 2021-09-04 DIAGNOSIS — Z85828 Personal history of other malignant neoplasm of skin: Principal | ICD-10-CM

## 2021-09-04 DIAGNOSIS — L578 Other skin changes due to chronic exposure to nonionizing radiation: Principal | ICD-10-CM

## 2021-09-04 DIAGNOSIS — D492 Neoplasm of unspecified behavior of bone, soft tissue, and skin: Principal | ICD-10-CM

## 2021-09-04 LAB — CBC W/ AUTO DIFF
BASOPHILS ABSOLUTE COUNT: 0.1 10*9/L (ref 0.0–0.1)
BASOPHILS RELATIVE PERCENT: 0.9 %
EOSINOPHILS ABSOLUTE COUNT: 0.2 10*9/L (ref 0.0–0.5)
EOSINOPHILS RELATIVE PERCENT: 2.5 %
HEMATOCRIT: 42.9 % (ref 39.0–48.0)
HEMOGLOBIN: 14.5 g/dL (ref 12.9–16.5)
LYMPHOCYTES ABSOLUTE COUNT: 3.3 10*9/L (ref 1.1–3.6)
LYMPHOCYTES RELATIVE PERCENT: 39.6 %
MEAN CORPUSCULAR HEMOGLOBIN CONC: 33.8 g/dL (ref 32.0–36.0)
MEAN CORPUSCULAR HEMOGLOBIN: 32.4 pg (ref 25.9–32.4)
MEAN CORPUSCULAR VOLUME: 95.8 fL — ABNORMAL HIGH (ref 77.6–95.7)
MEAN PLATELET VOLUME: 9.1 fL (ref 6.8–10.7)
MONOCYTES ABSOLUTE COUNT: 0.9 10*9/L — ABNORMAL HIGH (ref 0.3–0.8)
MONOCYTES RELATIVE PERCENT: 10.7 %
NEUTROPHILS ABSOLUTE COUNT: 3.9 10*9/L (ref 1.8–7.8)
NEUTROPHILS RELATIVE PERCENT: 46.3 %
NUCLEATED RED BLOOD CELLS: 0 /100{WBCs} (ref <=4)
PLATELET COUNT: 213 10*9/L (ref 150–450)
RED BLOOD CELL COUNT: 4.48 10*12/L (ref 4.26–5.60)
RED CELL DISTRIBUTION WIDTH: 14.6 % (ref 12.2–15.2)
WBC ADJUSTED: 8.5 10*9/L (ref 3.6–11.2)

## 2021-09-04 LAB — AST: AST (SGOT): 17 U/L (ref <=34)

## 2021-09-04 LAB — BUN: BLOOD UREA NITROGEN: 12 mg/dL (ref 9–23)

## 2021-09-04 LAB — CREATININE
CREATININE: 1.12 mg/dL — ABNORMAL HIGH
EGFR CKD-EPI (2021) MALE: 66 mL/min/{1.73_m2} (ref >=60)

## 2021-09-04 LAB — ALT: ALT (SGPT): 18 U/L (ref 10–49)

## 2021-09-04 MED ORDER — METHOTREXATE SODIUM 2.5 MG TABLET
ORAL_TABLET | 2 refills | 0 days | Status: CP
Start: 2021-09-04 — End: ?

## 2021-09-04 NOTE — Unmapped (Addendum)
Increase Methotrexate to 6 tabs per week.    Shave biopsy   A shave biopsy involves numbing a small area of your skin and then obtaining a sample to help [place] with proper diagnosis or skin condition. Biopsy results typically return in 7 to 14 days.    To care for the area: Leave the bandage in place until the morning after your procedure is performed. On a daily basis, carefully remove the bandage, then shower or wash as usual. Allow water to run over the site. Please do not scrub. Carefully dry the area, then apply ointment (some people develop an allergy to Neosporin, so we recommend Vaseline orAquaphor). Cover the site with a fresh bandage. Should any bleeding occur, apply firm pressure for 15 minutes. The treated site will heal best if  a scab never forms (the wound heals by new skin cells traveling from the outside toward the middle-their journey is easier if no scab stands in their way).    Long-term care: the site will be more sensitive than your surrounding skin. Keep it covered, and remember to apply sunscreen every day to all your exposed skin. A scar may remain which is lighter or pinker than your normal skin. Your body will continue to improve your scar for up to one year.    Infection following this procedure is rare. However, if you are worried about the appearance of your site, contact your doctor. Complete healing may take up to one month. We have a physician on call at all times. If you have any concerns about the site, please call our clinic at [phone] Punch biopsy  Punch biopsy involves numbing a small area of your skin, then obtaining a sample to help [place] make a proper diagnosis of your skin condition. The biopsy site is typically closed with one to 3 small stitches to help the site heal. Biopsy results will usually return in 7-14 days.    To care for the area: Leave the bandage in place until the morning after your procedure is performed. On a daily basis, carefully remove the bandage, then shower or wash as usual. Allow water to run over the site. Please do not scrub. Carefully dry the area, then apply ointment (some people develop an allergy to Neosporin, so we recommend Vaseline or Aquaphor). Cover the site with a fresh bandage. Should any bleeding occur, apply firm pressure for 15 minutes. The treated site will heal best if  a scab never forms (the wound heals by new skin cells traveling from the outside toward the middle-their journey is easier if no scab stands in their way).    Long-term care: the site will be more sensitive than your surrounding skin. Keep it covered, and remember to apply sunscreen every day to all your exposed skin. A scar may remain which is lighter or pinker than your normal skin. Your body will continue to improve your scar for up to one year.    Infection following this procedure is rare. However, if you are worried about the appearance of your site, contact your doctor. Complete healing of the site may take up to one month. We have a physician on call at all times. If you have any concerns about the site, please call our clinic at [phone]    Your stitches should be removed in 1 to 2 weeks. Typically it is about 1 week for the face and 2 weeks for other areas. Please follow the time frame given to you at your appointment.

## 2021-09-06 NOTE — Unmapped (Signed)
Labs normal - ok to proceed with plan as described in note.

## 2021-09-08 NOTE — Unmapped (Signed)
Diagnosis  A:  Left temple, punch  -Suppurative folliculitis and abscess.  -Negative for malignancy in sections examined.  ??  B:  Nose, shave  -Actinic keratosis, extending to edge.  ??  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    Pt notified.     Lesion A - folliculitis - no further treatment   Lesion B - AK on nose - Efudex cream 2x/day for 1-3 weeks as tolerated  Lesion C - KA on back - will have him return for curettage and IL5FU     [name] - can we get Mr. Totherow back to see me within the next 4-6 weeks in [facility]? - thanks, PJ

## 2021-09-08 NOTE — Unmapped (Signed)
-----   Message from Arvilla Market, MD sent at 09/08/2021 11:06 AM EDT -----  Diagnosis  A:  Left temple, punch  -Suppurative folliculitis and abscess.  -Negative for malignancy in sections examined.  ??  B:  Nose, shave  -Actinic keratosis, extending to edge.  ??  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    Pt notified.     Lesion A - folliculitis - no further treatment   Lesion B - AK on nose - Efudex cream 2x/day for 1-3 weeks as tolerated  Lesion C - KA on back - will have him return for curettage and IL5FU     Randy Lindsey - can we get Randy Lindsey back to see me within the next 4-6 weeks in [facility]? - thanks, Randy Lindsey

## 2021-09-09 NOTE — Unmapped (Signed)
-----   Message from Arvilla Market, MD sent at 09/08/2021 11:06 AM EDT -----  Diagnosis  A:  Left temple, punch  -Suppurative folliculitis and abscess.  -Negative for malignancy in sections examined.  ??  B:  Nose, shave  -Actinic keratosis, extending to edge.  ??  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    Pt notified.     Lesion A - folliculitis - no further treatment   Lesion B - AK on nose - Efudex cream 2x/day for 1-3 weeks as tolerated  Lesion C - KA on back - will have him return for curettage and IL5FU     [name] - can we get Randy Lindsey back to see me within the next 4-6 weeks in [facility]? - thanks, PJ

## 2021-10-28 DIAGNOSIS — L2081 Atopic neurodermatitis: Principal | ICD-10-CM

## 2021-10-28 MED ORDER — METHOTREXATE SODIUM 2.5 MG TABLET
ORAL_TABLET | 0 refills | 0 days
Start: 2021-10-28 — End: ?

## 2021-10-29 MED ORDER — METHOTREXATE SODIUM 2.5 MG TABLET
ORAL_TABLET | 0 refills | 0 days | Status: CP
Start: 2021-10-29 — End: ?

## 2021-11-10 NOTE — Unmapped (Signed)
Call from patient on nurse line stating he needs a refill for methotrexate.  Tried to call patient back to find out more information since a refill was sent on 10/29/21, but had to leave a generic message.    [facility] pharmacy in [place].  Have refills on methotrexate but will need to switch manufacturers since old manufacturer not available.Just need OK to switch.  OK'd them to switch manufacturers.    Pharmacy will contact patient with refill .

## 2021-11-18 ENCOUNTER — Ambulatory Visit: Admit: 2021-11-18 | Discharge: 2021-11-19 | Payer: MEDICARE

## 2021-11-18 DIAGNOSIS — Z85828 Personal history of other malignant neoplasm of skin: Principal | ICD-10-CM

## 2021-11-18 DIAGNOSIS — Z79899 Other long term (current) drug therapy: Principal | ICD-10-CM

## 2021-11-18 DIAGNOSIS — L570 Actinic keratosis: Principal | ICD-10-CM

## 2021-11-18 DIAGNOSIS — L111 Transient acantholytic dermatosis [Grover]: Principal | ICD-10-CM

## 2021-11-18 DIAGNOSIS — L821 Other seborrheic keratosis: Principal | ICD-10-CM

## 2021-11-18 DIAGNOSIS — L2081 Atopic neurodermatitis: Principal | ICD-10-CM

## 2021-11-18 DIAGNOSIS — L578 Other skin changes due to chronic exposure to nonionizing radiation: Principal | ICD-10-CM

## 2021-11-18 LAB — CBC W/ AUTO DIFF
BASOPHILS ABSOLUTE COUNT: 0 10*9/L (ref 0.0–0.1)
BASOPHILS RELATIVE PERCENT: 0.5 %
EOSINOPHILS ABSOLUTE COUNT: 0.1 10*9/L (ref 0.0–0.5)
EOSINOPHILS RELATIVE PERCENT: 1.2 %
HEMATOCRIT: 42.4 % (ref 39.0–48.0)
HEMOGLOBIN: 14 g/dL (ref 12.9–16.5)
LYMPHOCYTES ABSOLUTE COUNT: 2.5 10*9/L (ref 1.1–3.6)
LYMPHOCYTES RELATIVE PERCENT: 29 %
MEAN CORPUSCULAR HEMOGLOBIN CONC: 33 g/dL (ref 32.0–36.0)
MEAN CORPUSCULAR HEMOGLOBIN: 31.9 pg (ref 25.9–32.4)
MEAN CORPUSCULAR VOLUME: 96.8 fL — ABNORMAL HIGH (ref 77.6–95.7)
MEAN PLATELET VOLUME: 10.6 fL (ref 6.8–10.7)
MONOCYTES ABSOLUTE COUNT: 0.7 10*9/L (ref 0.3–0.8)
MONOCYTES RELATIVE PERCENT: 8.8 %
NEUTROPHILS ABSOLUTE COUNT: 5.2 10*9/L (ref 1.8–7.8)
NEUTROPHILS RELATIVE PERCENT: 60.5 %
PLATELET COUNT: 197 10*9/L (ref 150–450)
RED BLOOD CELL COUNT: 4.38 10*12/L (ref 4.26–5.60)
RED CELL DISTRIBUTION WIDTH: 14.3 % (ref 12.2–15.2)
WBC ADJUSTED: 8.5 10*9/L (ref 3.6–11.2)

## 2021-11-18 LAB — ALT: ALT (SGPT): 14 U/L (ref 10–49)

## 2021-11-18 LAB — AST: AST (SGOT): 18 U/L (ref <=34)

## 2021-11-18 LAB — CREATININE
CREATININE: 1.11 mg/dL — ABNORMAL HIGH
EGFR CKD-EPI (2021) MALE: 67 mL/min/{1.73_m2} (ref >=60)

## 2021-11-18 LAB — BUN: BLOOD UREA NITROGEN: 20 mg/dL (ref 9–23)

## 2021-11-18 MED ORDER — FOLIC ACID 1 MG TABLET
ORAL_TABLET | 5 refills | 0 days | Status: CP
Start: 2021-11-18 — End: ?

## 2021-11-18 MED ORDER — METHOTREXATE SODIUM 2.5 MG TABLET
ORAL_TABLET | 3 refills | 0 days | Status: CP
Start: 2021-11-18 — End: ?

## 2021-11-18 NOTE — Unmapped (Signed)
Labs stable - ok to continue methotrexate as described in note. PT notified

## 2021-11-18 NOTE — Unmapped (Signed)
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  - Continue doxepin elixir nightly   - Continue topical steroids as needed    (2) High Risk Medication use - methotrexate:    - CBC w/ diff and LFTs today     (3) Actinic Keratoses:    Risks, benefits and adverse effects from cryotherapy were reviewed including the possibility of blister formation after procedure and hypopigmentation after the areas heal.  After verbal consent was obtained, sites were treated with liquid nitrogen x 1, 10-second freeze.  Post-procedure wound care was reviewed.      Location(s): L-cheek and neck   Total number of lesions treated: 2     (4) History of NMSC: chronic, stable     No evidence of recurrence  The importance of regular skin checks and sun protection was reviewed  If pt experiences any tenderness at site or any regrowth/nodularity, he/she should contact our office for further evaluation    (5) Seborrheic Keratoses:    I counseled the patient regarding the following:    Seborrheic keratoses are benign and no treatment is necessary. Lesions can be warty, smooth, flat or raised. Patients can get more of these lesions as they age.   If lesions change and become enlarged, tender, burn or change colors, pt was instructed to contact us for further evaluation.     (6) Benign appearing lentigines and actinic skin damage:    Pt was reassured.  No concerning lesions warranting biopsy  The importance of sun protection and using OTC broad spectrum, SPF 30 or higher sunscreen was reviewed.     The patient was advised to call for an appointment should any new, changing ,or symptomatic lesion develop.     RTC: 3 months or sooner as needed  _____________________________________________________________________________________    Referring Provider: Referred Self     Chief Complaint Chief Complaint   Patient presents with    Skin Problem     6 week f/u         HPI     Randy Lindsey is a pleasant 81 y.o. male, who presents as a returning patient (last seen 09/04/2021) to [facility] for  follow up of skin lesions and chronic atopic neurodermatitis .     Skin Lesions  Location: R-cheek and neck   Duration: weeks  Character: tender, scaly and red  Treatment(s): none   Aggravating factor(s): --  Alleviating factor(s): --    The patient denies any other new or changing lesions or areas of concern. He ran out of methotrexate and has had a recent flare of his atopic neurodermatitis. He is on doxepin but ran out of his methotrexate.     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    Past Medical History, Family History, Social History , Med List, Allergies, Problem List reviewed in the rooming section of Epic     ROS: Other than symptoms mentioned in the HPI,   No fevers, chills, or other skin complaints.    Physical Examination     Physical Exam: Patient is a well developed caucasian male in no apparent distress.  Alert and oriented x 3.  Skin: Examination of the patient's head, neck, chest, abdomen, back, buttocks, bilateral upper and lower extremities was performed and all areas not specifically commented on were within normal limits:  -- scattered tan macules, rhytids and speckled pigment change c/w previous sun exposure   -- several scattered stuck-on tan macules and papules over trunk and extremities   -- well-healed scars at sites of previous excisions/biopsies   -- a few excoriations on trunk and extremities  -- scattered gritty erythematous macules over sun exposed areas on the skin on the face       All areas not commented on are within normal limits.

## 2021-11-20 ENCOUNTER — Ambulatory Visit: Admit: 2021-11-20 | Discharge: 2021-11-21 | Payer: MEDICARE

## 2021-11-20 DIAGNOSIS — L723 Sebaceous cyst: Principal | ICD-10-CM

## 2021-11-20 DIAGNOSIS — D492 Neoplasm of unspecified behavior of bone, soft tissue, and skin: Principal | ICD-10-CM

## 2021-11-20 NOTE — Unmapped (Signed)
Dermatology Procedure Note (Excision)    Assessment and Plan     (1) Likely inflamed cyst on posterior neck    Risks, benefits and alternatives to excision were discussed today.  At the end of the discussion, the patient signed informed written consent.     -- Lesion site and patient's identity were verified and timeout was performed.       -- Area was cleaned and anesthetize with 4 ml Lidocaine with Epinephrine (1:100,000).    -- Area was then prepped and draped in a sterile manner and tissue was incised down to subcutaneous tissue using a #15 blade.     -- Specimen was removed en bloc and submitted for histopathologic analysis.     -- Area was then undermined using sharp iris scissors and hemostasis was achieved using a combination of manual pressure and brief electrodessication.      -- Defect was closed in a linear layered manner using 5  x 4-0 monocryl sutures deep and  1 x 5-0 fast absorbing gut sutures superficially (running locked cuticular).     Lesion Size - 2.7cm  Total Wound Length - 4cm    -- Pt. will be contacted with pathology results once they become available. Post procedure wound care was reviewed at length.     -- F/U in 3 months as scheduled       Subjective:     Randy Lindsey is a 81 y.o. yo male who was last seen 11/18/21.  At that time, he reported a tender, growing nodule on his posterior neck. He is here for definitive treatment by excision.      Lesions biopsied during last visit:    Diagnosis   Date Value Ref Range Status   09/04/2021   Final    A:  Left temple, punch  -Suppurative folliculitis and abscess.  -Negative for malignancy in sections examined.    B:  Nose, shave  -Actinic keratosis, extending to edge.    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         Objective:     Well-developed, well-nourished male in no apparent distress.  Alert and oriented x 3.  Focused examination of the patient's neck was significant for:  -- 2-3cm tender nodule on the posterior neck

## 2021-11-20 NOTE — Unmapped (Signed)
Managing your wound after skin surgery    Please avoid strenuous activity for 48 hours and all heavy exercise for 1 week  If your wound is on your arm, leg, or shoulder, then avoid heavy lifting, straining, or exercise with that limb for at least one week.  If your wound is on the head or neck, avoid bending over if at all possible.  Sleep with your head elevated for 48 hours to reduce the risk of bleeding.  Please don???t smoke for 3 weeks; smoking prevents healthy wound healing  Pain management: Use  600 mg of acetaminophen (Tylenol ) every 4 hours, or medication we prescribed as needed. Pain should decrease steadily for the 1st few days after your surgery.   Possible complications:  Bleeding: a small amount of blood on the bandage is normal.   If there is a significant bleeding into the bandage or beneath the stitches (this will look like swelling and purple discoloration), apply firm pressure  with a cloth or bandage for 20 minutes without interruption. Repeat if bleeding continues. If it persists, please call [phone]. During nonoffice hours use the message of this number to contact the on-call dermatologist or go to the emergency room.  Infection: usually indicated by pain that increases 4 to 6 days after surgery. Mild redness, swelling, soreness are normal, but, with increasing redness, pain, drainage, and swelling, you should contact our office.  Decreased sensation, increased sensitivity, and itching may last for 18 months.  Scarring: scars mature for one year after surgery. Reducing motion and tension over the wound for the 1st month, as well as using sun protection, reduces scarring.  Bruising is normal around the wound and resolves over 2 to 3 weeks.  For wounds with dissolving stitches:  Please keep the original bandage in place for at least 48 hours. You can remove the large outer layer at that time and keep the small bandage clean and dry for one week or until you return. If the  bandage becomes soiled, wet, or detached, you can reinforce it with tape or add petrolatum  (Vaseline) to the stitches, then place a fresh, clean, nonstick bandage (telfa) with tape or use a non stick Band-Aid.  For wounds with stitches that need to be removed:  Please keep the original bandage in place for at least 48 hours. You can remove the large outer bandage at that time. You may leave the smaller bandage in place until it falls off by itself, or change it after it gets wet with bathing. To change, apply a layer of petrolatum (Vaseline) over top of the Steri-Strips (thin white stripes) and cover with a nonstick stick bandage (telfa) and  paper tape or use a non stick Band-Aid.  If you???re changing the bandage, you can gently remove any crust with warm water and mild soap using a soft cloth a Q-tip. Do not use triple antibiotic ointment, Neosporin, or hydrogen peroxide.  After the stitches and bandages have been removed: clean daily with water with warm water and gentle soap using a soft cloth. You can continue to use a small bandage and sunscreen for one to 2 months or until fully healed.    If instructed to use vinegar soaks, use a fresh bottle of white vinegar and bottled water to make the soak  Please add 1 cup of white vinegar to a quart of room temperature water.  Soak affected area for 10-20 minutes once or twice a day.

## 2021-11-24 NOTE — Unmapped (Signed)
Diagnosis  Neck, excision  - Follicular inclusion cyst  - The lesion appears completely excised    Pt notified. No further treatment needed.

## 2021-12-18 DIAGNOSIS — R21 Rash and other nonspecific skin eruption: Principal | ICD-10-CM

## 2021-12-18 MED ORDER — FLUCONAZOLE 150 MG TABLET
ORAL_TABLET | 0 refills | 0 days | Status: CP
Start: 2021-12-18 — End: ?

## 2021-12-26 ENCOUNTER — Ambulatory Visit: Admit: 2021-12-26 | Discharge: 2021-12-27 | Payer: MEDICARE

## 2021-12-26 DIAGNOSIS — Z79899 Other long term (current) drug therapy: Principal | ICD-10-CM

## 2021-12-26 DIAGNOSIS — D492 Neoplasm of unspecified behavior of bone, soft tissue, and skin: Principal | ICD-10-CM

## 2021-12-26 DIAGNOSIS — Z85828 Personal history of other malignant neoplasm of skin: Principal | ICD-10-CM

## 2021-12-26 DIAGNOSIS — L299 Pruritus, unspecified: Principal | ICD-10-CM

## 2021-12-26 MED ORDER — TRIAMCINOLONE ACETONIDE 0.1 % TOPICAL CREAM
3 refills | 0 days | Status: CP
Start: 2021-12-26 — End: ?

## 2021-12-30 DIAGNOSIS — C44222 Squamous cell carcinoma of skin of right ear and external auricular canal: Principal | ICD-10-CM

## 2022-01-05 ENCOUNTER — Ambulatory Visit: Admit: 2022-01-05 | Discharge: 2022-01-06 | Payer: MEDICARE

## 2022-01-05 MED ORDER — TRAMADOL 50 MG TABLET
ORAL_TABLET | Freq: Four times a day (QID) | ORAL | 0 refills | 3 days | Status: CP | PRN
Start: 2022-01-05 — End: ?

## 2022-01-12 ENCOUNTER — Ambulatory Visit: Admit: 2022-01-12 | Discharge: 2022-01-13 | Payer: MEDICARE

## 2022-04-09 ENCOUNTER — Ambulatory Visit: Admit: 2022-04-09 | Discharge: 2022-04-09 | Payer: MEDICARE

## 2022-04-09 DIAGNOSIS — L2081 Atopic neurodermatitis: Principal | ICD-10-CM

## 2022-04-09 DIAGNOSIS — Z79899 Other long term (current) drug therapy: Principal | ICD-10-CM

## 2022-04-09 DIAGNOSIS — L814 Other melanin hyperpigmentation: Principal | ICD-10-CM

## 2022-04-09 DIAGNOSIS — D229 Melanocytic nevi, unspecified: Principal | ICD-10-CM

## 2022-04-09 DIAGNOSIS — L821 Other seborrheic keratosis: Principal | ICD-10-CM

## 2022-04-09 DIAGNOSIS — L570 Actinic keratosis: Principal | ICD-10-CM

## 2022-04-09 DIAGNOSIS — Z85828 Personal history of other malignant neoplasm of skin: Principal | ICD-10-CM

## 2022-04-09 DIAGNOSIS — L720 Epidermal cyst: Principal | ICD-10-CM

## 2022-04-09 DIAGNOSIS — L578 Other skin changes due to chronic exposure to nonionizing radiation: Principal | ICD-10-CM

## 2022-04-09 DIAGNOSIS — D1801 Hemangioma of skin and subcutaneous tissue: Principal | ICD-10-CM

## 2022-04-09 MED ORDER — FLUOROURACIL 5 % TOPICAL CREAM
3 refills | 0.00000 days | Status: CP
Start: 2022-04-09 — End: ?

## 2022-05-01 DIAGNOSIS — R21 Rash and other nonspecific skin eruption: Principal | ICD-10-CM

## 2022-05-01 MED ORDER — FLUCONAZOLE 150 MG TABLET
ORAL_TABLET | 0 refills | 0.00000 days | Status: CP
Start: 2022-05-01 — End: ?

## 2022-05-11 DIAGNOSIS — L2081 Atopic neurodermatitis: Principal | ICD-10-CM

## 2022-05-11 MED ORDER — METHOTREXATE SODIUM 2.5 MG TABLET
ORAL_TABLET | 2 refills | 0 days | Status: CP
Start: 2022-05-11 — End: ?

## 2022-07-03 DIAGNOSIS — L111 Transient acantholytic dermatosis [Grover]: Principal | ICD-10-CM

## 2022-07-03 MED ORDER — FOLIC ACID 1 MG TABLET
ORAL_TABLET | 0 refills | 0 days | Status: CP
Start: 2022-07-03 — End: ?

## 2022-08-06 ENCOUNTER — Ambulatory Visit: Admit: 2022-08-06 | Discharge: 2022-08-07 | Payer: MEDICARE

## 2022-08-06 DIAGNOSIS — L2081 Atopic neurodermatitis: Principal | ICD-10-CM

## 2022-08-06 DIAGNOSIS — D492 Neoplasm of unspecified behavior of bone, soft tissue, and skin: Principal | ICD-10-CM

## 2022-08-06 DIAGNOSIS — L821 Other seborrheic keratosis: Principal | ICD-10-CM

## 2022-08-06 DIAGNOSIS — L820 Inflamed seborrheic keratosis: Principal | ICD-10-CM

## 2022-08-06 DIAGNOSIS — L578 Other skin changes due to chronic exposure to nonionizing radiation: Principal | ICD-10-CM

## 2022-08-06 DIAGNOSIS — L570 Actinic keratosis: Principal | ICD-10-CM

## 2022-08-06 DIAGNOSIS — Z85828 Personal history of other malignant neoplasm of skin: Principal | ICD-10-CM

## 2022-08-06 DIAGNOSIS — L814 Other melanin hyperpigmentation: Principal | ICD-10-CM

## 2022-08-06 DIAGNOSIS — B001 Herpesviral vesicular dermatitis: Principal | ICD-10-CM

## 2022-08-06 DIAGNOSIS — D1801 Hemangioma of skin and subcutaneous tissue: Principal | ICD-10-CM

## 2022-08-06 DIAGNOSIS — D229 Melanocytic nevi, unspecified: Principal | ICD-10-CM

## 2022-08-06 DIAGNOSIS — Z79899 Other long term (current) drug therapy: Principal | ICD-10-CM

## 2022-08-06 MED ORDER — VALACYCLOVIR 500 MG TABLET
ORAL_TABLET | ORAL | 5 refills | 0.00000 days | Status: CP
Start: 2022-08-06 — End: ?

## 2022-08-06 MED ORDER — METHOTREXATE SODIUM 2.5 MG TABLET
ORAL_TABLET | ORAL | 0 refills | 84.00000 days | Status: CP
Start: 2022-08-06 — End: 2022-11-04

## 2022-08-28 DIAGNOSIS — L2081 Atopic neurodermatitis: Principal | ICD-10-CM

## 2022-08-28 MED ORDER — METHOTREXATE SODIUM 2.5 MG TABLET
ORAL_TABLET | 0 refills | 0 days
Start: 2022-08-28 — End: ?

## 2022-09-01 MED ORDER — METHOTREXATE SODIUM 2.5 MG TABLET
ORAL_TABLET | ORAL | 0 refills | 14 days | Status: CP
Start: 2022-09-01 — End: ?

## 2022-09-10 ENCOUNTER — Ambulatory Visit: Admit: 2022-09-10 | Discharge: 2022-09-11 | Payer: MEDICARE

## 2022-09-10 DIAGNOSIS — Z85828 Personal history of other malignant neoplasm of skin: Principal | ICD-10-CM

## 2022-09-10 DIAGNOSIS — B001 Herpesviral vesicular dermatitis: Principal | ICD-10-CM

## 2022-09-10 DIAGNOSIS — D492 Neoplasm of unspecified behavior of bone, soft tissue, and skin: Principal | ICD-10-CM

## 2022-09-10 DIAGNOSIS — L2081 Atopic neurodermatitis: Principal | ICD-10-CM

## 2022-09-10 DIAGNOSIS — L111 Transient acantholytic dermatosis [Grover]: Principal | ICD-10-CM

## 2022-09-10 MED ORDER — FOLIC ACID 1 MG TABLET
ORAL_TABLET | 0 refills | 0.00000 days | Status: CP
Start: 2022-09-10 — End: ?

## 2022-09-10 MED ORDER — VALACYCLOVIR 500 MG TABLET
ORAL_TABLET | ORAL | 5 refills | 0.00000 days | Status: CP
Start: 2022-09-10 — End: ?

## 2022-09-10 MED ORDER — METHOTREXATE SODIUM 2.5 MG TABLET
ORAL_TABLET | 2 refills | 0.00000 days | Status: CP
Start: 2022-09-10 — End: ?

## 2022-10-20 DIAGNOSIS — L2081 Atopic neurodermatitis: Principal | ICD-10-CM

## 2022-10-20 MED ORDER — METHOTREXATE SODIUM 2.5 MG TABLET
ORAL_TABLET | 2 refills | 0.00000 days | Status: CN
Start: 2022-10-20 — End: ?

## 2022-11-19 ENCOUNTER — Ambulatory Visit: Admit: 2022-11-19 | Discharge: 2022-11-19 | Payer: MEDICARE

## 2022-11-19 ENCOUNTER — Other Ambulatory Visit: Admit: 2022-11-19 | Discharge: 2022-11-19 | Payer: MEDICARE

## 2022-11-19 DIAGNOSIS — R21 Rash and other nonspecific skin eruption: Principal | ICD-10-CM

## 2022-11-19 DIAGNOSIS — L2081 Atopic neurodermatitis: Principal | ICD-10-CM

## 2022-11-19 DIAGNOSIS — L821 Other seborrheic keratosis: Principal | ICD-10-CM

## 2022-11-19 DIAGNOSIS — L570 Actinic keratosis: Principal | ICD-10-CM

## 2022-11-19 DIAGNOSIS — L578 Other skin changes due to chronic exposure to nonionizing radiation: Principal | ICD-10-CM

## 2022-11-19 DIAGNOSIS — Z79899 Other long term (current) drug therapy: Principal | ICD-10-CM

## 2022-11-19 DIAGNOSIS — L820 Inflamed seborrheic keratosis: Principal | ICD-10-CM

## 2022-11-19 DIAGNOSIS — L299 Pruritus, unspecified: Principal | ICD-10-CM

## 2022-11-19 MED ORDER — DOXEPIN 10 MG/ML ORAL CONCENTRATE
Freq: Every evening | ORAL | 2 refills | 40.00000 days | Status: CP
Start: 2022-11-19 — End: ?

## 2022-11-19 MED ORDER — METHOTREXATE SODIUM 2.5 MG TABLET
ORAL_TABLET | 2 refills | 0.00000 days | Status: CP
Start: 2022-11-19 — End: ?

## 2022-11-19 MED ORDER — FLUCONAZOLE 150 MG TABLET
ORAL_TABLET | 0 refills | 0.00000 days | Status: CP
Start: 2022-11-19 — End: ?

## 2022-12-15 DIAGNOSIS — L111 Transient acantholytic dermatosis [Grover]: Principal | ICD-10-CM

## 2022-12-15 MED ORDER — FOLIC ACID 1 MG TABLET
ORAL_TABLET | 0 refills | 0.00000 days
Start: 2022-12-15 — End: ?

## 2022-12-27 MED ORDER — FOLIC ACID 1 MG TABLET
ORAL_TABLET | 0 refills | 0.00000 days | Status: CP
Start: 2022-12-27 — End: ?

## 2023-03-24 DIAGNOSIS — L2081 Atopic neurodermatitis: Principal | ICD-10-CM

## 2023-03-24 MED ORDER — METHOTREXATE SODIUM 2.5 MG TABLET
ORAL_TABLET | 0 refills | 0.00000 days
Start: 2023-03-24 — End: ?

## 2023-03-29 DIAGNOSIS — L111 Transient acantholytic dermatosis [Grover]: Principal | ICD-10-CM

## 2023-03-29 MED ORDER — FOLIC ACID 1 MG TABLET
ORAL_TABLET | 0 refills | 0.00000 days
Start: 2023-03-29 — End: ?

## 2023-03-31 MED ORDER — FOLIC ACID 1 MG TABLET
ORAL_TABLET | 0 refills | 0.00000 days | Status: CP
Start: 2023-03-31 — End: ?

## 2023-03-31 MED ORDER — METHOTREXATE SODIUM 2.5 MG TABLET
ORAL_TABLET | 0 refills | 0.00000 days | Status: CP
Start: 2023-03-31 — End: ?

## 2023-04-08 ENCOUNTER — Ambulatory Visit: Admit: 2023-04-08 | Discharge: 2023-04-08 | Payer: MEDICARE

## 2023-04-08 DIAGNOSIS — L821 Other seborrheic keratosis: Principal | ICD-10-CM

## 2023-04-08 DIAGNOSIS — R21 Rash and other nonspecific skin eruption: Principal | ICD-10-CM

## 2023-04-08 DIAGNOSIS — L820 Inflamed seborrheic keratosis: Principal | ICD-10-CM

## 2023-04-08 DIAGNOSIS — D492 Neoplasm of unspecified behavior of bone, soft tissue, and skin: Principal | ICD-10-CM

## 2023-04-08 DIAGNOSIS — Z79899 Other long term (current) drug therapy: Principal | ICD-10-CM

## 2023-04-08 DIAGNOSIS — L570 Actinic keratosis: Principal | ICD-10-CM

## 2023-04-08 DIAGNOSIS — L578 Other skin changes due to chronic exposure to nonionizing radiation: Principal | ICD-10-CM

## 2023-05-10 ENCOUNTER — Ambulatory Visit
Admission: EM | Admit: 2023-05-10 | Discharge: 2023-05-10 | Disposition: A | Discharge status: Home / Self Care | Payer: Medicare PPO

## 2023-05-10 ENCOUNTER — Ambulatory Visit: Payer: Medicare PPO

## 2023-05-10 ENCOUNTER — Telehealth: Payer: Self-pay | Admitting: Emergency Medicine

## 2023-05-10 DIAGNOSIS — B9689 Other specified bacterial agents as the cause of diseases classified elsewhere: Secondary | ICD-10-CM | POA: Insufficient documentation

## 2023-05-10 DIAGNOSIS — K5900 Constipation, unspecified: Secondary | ICD-10-CM

## 2023-05-10 DIAGNOSIS — N3000 Acute cystitis without hematuria: Secondary | ICD-10-CM

## 2023-05-10 DIAGNOSIS — R1030 Lower abdominal pain, unspecified: Secondary | ICD-10-CM | POA: Diagnosis present

## 2023-05-10 DIAGNOSIS — M47815 Spondylosis without myelopathy or radiculopathy, thoracolumbar region: Secondary | ICD-10-CM | POA: Insufficient documentation

## 2023-05-10 LAB — URINALYSIS, W/ REFLEX TO CULTURE (INFECTION SUSPECTED)
Glucose, UA: NEGATIVE mg/dL
Hgb urine dipstick: NEGATIVE
Ketones, ur: NEGATIVE mg/dL
Nitrite: NEGATIVE
Protein, ur: 30 mg/dL — AB
RBC / HPF: NONE SEEN RBC/hpf (ref 0–5)
Specific Gravity, Urine: 1.02 (ref 1.005–1.030)
pH: 5.5 (ref 5.0–8.0)

## 2023-05-10 MED ORDER — NITROFURANTOIN MONOHYD MACRO 100 MG PO CAPS: 100.0000 mg | ORAL_CAPSULE | Freq: Two times a day (BID) | ORAL | 0 refills | Status: AC

## 2023-05-10 NOTE — ED Triage Notes (Signed)
Pt presents to UC c/o possible kidney infection, c/o fevers onset last week intermittent, feeling hot/sweats, cramping lower abdomen. Pt states he has also been having trouble holding urine, pt also states he has been trouble having bowel movements. Pt thinks his last bowel movement was last Wednesday. Pt has been taking prune juice and cranberry juice and states it usually helps but not this time.

## 2023-05-10 NOTE — Telephone Encounter (Signed)
Reported abdominal x-ray results via telephone, 2 patient identifiers she used, discussed coffee being like presentation at the sigmoid colon that is of concern on x-ray, as it can be consistent with other findings at the colon discussed with patient that I would like him to continue with treatment plan of MiraLAX twice daily until full bowel movement occurs and then discontinuation of medication, ideally this will resolve abdominal cramping and bloating however discussed that if symptoms continue to persist past defecation that he will need to follow-up with his primary doctor as CT imaging was recommended, verbalized understanding, no questions asked at this time

## 2023-05-10 NOTE — ED Provider Notes (Signed)
[facility]    CSN: [id number] Arrival date & time: 05/10/23  2536      History   Chief Complaint Chief Complaint  Patient presents with   Abdominal Cramping   Constipation   Back Pain    HPI Steve Rhodes. is a 82 y.o. male.   Patient presents for evaluation of subjective fever, urinary urgency, dysuria and lower abdominal cramping present for 7 to 10 days.  Lower abdominal cramping occurring intermittently, wraps around to the bilateral flank and lower back.  Also experiencing urinary incontinence has been having to wear a pad.  Concerned with constipation his last bowel movement approximately 5 days ago.  Has been experiencing pain with defecation and hardness to the stools.  Denies presence of blood or mucus within the stool.  Has been experiencing abdominal bloating that worsens after consumption of food.  Has attempted use of cranberry juice and prune juice which has been ineffective.   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   GERD (gastroesophageal reflux disease)    Grover's disease    Hyperlipidemia    Hypertension    Mitral valve disorder     Patient Active Problem List   Diagnosis Date Noted   Gastroesophageal reflux disease 11/12/2017   Abnormal biliary HIDA scan 11/12/2017    Past Surgical History:  Procedure Laterality Date   CARDIAC CATHETERIZATION Left 01/28/2016   Procedure: Left Heart Cath and Coronary Angiography;  Surgeon: Dalia Heading, MD;  Location: [facility] INVASIVE CV LAB;  Service: Cardiovascular;  Laterality: Left;   COLONOSCOPY WITH PROPOFOL N/A 05/10/2015   Procedure: COLONOSCOPY WITH PROPOFOL;  Surgeon: Wallace Cullens, MD;  Location: [facility] ENDOSCOPY;  Service: Gastroenterology;  Laterality: N/A;   ESOPHAGOGASTRODUODENOSCOPY (EGD) WITH PROPOFOL N/A 05/10/2015   Procedure: ESOPHAGOGASTRODUODENOSCOPY (EGD) WITH PROPOFOL;  Surgeon: Wallace Cullens, MD;  Location: [facility] ENDOSCOPY;  Service: Gastroenterology;  Laterality: N/A;   ESOPHAGOGASTRODUODENOSCOPY (EGD) WITH PROPOFOL N/A 06/10/2015   Procedure: ESOPHAGOGASTRODUODENOSCOPY (EGD) WITH PROPOFOL;  Surgeon: Wallace Cullens, MD;  Location: [facility] ENDOSCOPY;  Service: Gastroenterology;  Laterality: N/A;   ESOPHAGOGASTRODUODENOSCOPY (EGD) WITH PROPOFOL N/A 06/29/2016   Procedure: ESOPHAGOGASTRODUODENOSCOPY (EGD) WITH PROPOFOL;  Surgeon: Christena Deem, MD;  Location: [facility] ENDOSCOPY;  Service: Endoscopy;  Laterality: N/A;   SKIN CANCER EXCISION     on arms       Home Medications    Prior to Admission medications   Medication Sig Start Date End Date Taking? Authorizing Provider  atorvastatin (LIPITOR) 10 MG tablet Take 10 mg by mouth daily.   Yes [provider]  cetirizine (ZYRTEC) 10 MG tablet Take 10 mg by mouth daily.   Yes [provider]  doxepin (SINEQUAN) 10 MG capsule Take 10 mg by mouth.   Yes [provider]  folic acid (FOLVITE) 1 MG tablet TAKE 3 TABLETS BY MOUTH ONCE DAILY ON  YOUR  NON  METHOTREXATE  DAYS 11/07/19  Yes [provider]  lisinopril-hydrochlorothiazide (PRINZIDE,ZESTORETIC) 20-25 MG tablet Take 1 tablet by mouth daily.   Yes [provider]  methotrexate 2.5 MG tablet TAKE 5 TABLETS BY MOUTH ONCE A WEEK 01/01/20  Yes [provider]  diphenoxylate-atropine (LOMOTIL) 2.5-0.025 MG tablet Take 1 tablet by mouth 4 (four) times daily as needed for diarrhea or loose stools. 08/08/20   Tommie Sams, DO  metFORMIN (GLUCOPHAGE)  500 MG tablet Take 500 mg by mouth daily. 11/29/19   [provider]  oxybutynin (DITROPAN) 5 MG tablet Take 5 mg by mouth at bedtime.    [provider]  pantoprazole (PROTONIX) 40 MG tablet Take 40 mg by mouth daily.    [provider]  triamcinolone cream (KENALOG) 0.1 % Apply 1 application topically as needed (for rashy areas).    [provider]  valACYclovir  (VALTREX) 1000 MG tablet Take 1,000 mg by mouth 2 (two) times daily.    [provider]  zolpidem (AMBIEN) 10 MG tablet Take 10 mg by mouth at bedtime as needed for sleep.    [provider]    Family History Family History  Problem Relation Age of Onset   Colon cancer Neg Hx     Social History Social History   Tobacco Use   Smoking status: Never   Smokeless tobacco: Never  Vaping Use   Vaping status: Never Used  Substance Use Topics   Alcohol use: No   Drug use: No     Allergies   Latex and Sulfa antibiotics   Review of Systems Review of Systems   Physical Exam Triage Vital Signs ED Triage Vitals  Encounter Vitals Group     BP 05/10/23 1006 119/72     Systolic BP Percentile --      Diastolic BP Percentile --      Pulse Rate 05/10/23 1006 61     Resp --      Temp 05/10/23 1006 98.5 F (36.9 C)     Temp Source 05/10/23 1006 Oral     SpO2 05/10/23 1006 95 %     Weight --      Height --      Head Circumference --      Peak Flow --      Pain Score 05/10/23 1003 5     Pain Loc --      Pain Education --      Exclude from Growth Chart --    No data found.  Updated Vital Signs BP 119/72 (BP Location: Left Arm)   Pulse 61   Temp 98.5 F (36.9 C) (Oral)   SpO2 95%   Visual Acuity Right Eye Distance:   Left Eye Distance:   Bilateral Distance:    Right Eye Near:   Left Eye Near:    Bilateral Near:     Physical Exam Constitutional:      Appearance: Normal appearance.  HENT:     Head: Normocephalic.  Eyes:     Extraocular Movements: Extraocular movements intact.  Pulmonary:     Effort: Pulmonary effort is normal.  Abdominal:     General: Bowel sounds are normal. There is distension.     Palpations: Abdomen is soft.     Tenderness: There is abdominal tenderness in the right lower quadrant, epigastric area and left lower quadrant. There is no guarding.  Neurological:     Mental Status: He is alert and oriented to person, place,  and time. Mental status is at baseline.      UC Treatments / Results  Labs (all labs ordered are listed, but only abnormal results are displayed) Labs Reviewed  URINALYSIS, W/ REFLEX TO CULTURE (INFECTION SUSPECTED) - Abnormal; Notable for the following components:      Result Value   Color, Urine AMBER (*)    Bilirubin Urine SMALL (*)    Protein, ur 30 (*)    Leukocytes,Ua TRACE (*)  Bacteria, UA FEW (*)    All other components within normal limits  URINE CULTURE    EKG   Radiology No results found.  Procedures Procedures (including critical care time)  Medications Ordered in UC Medications - No data to display  Initial Impression / Assessment and Plan / UC Course  I have reviewed the triage vital signs and the nursing notes.  Pertinent labs & imaging results that were available during my care of the patient were reviewed by me and considered in my medical decision making (see chart for details).  Acute cystitis without hematuria, constipation  Vitals are stable, patient is in no signs of distress nontoxic-appearing, tenderness noted to the abdominal exam as well as distention however abdomen is soft and he is not guarding and denies significant pain, stable for outpatient management, urinalysis showing leukocytes and bacteria, sent for culture, prescribed Macrobid for treatment and discussed additional supportive measures, preliminary read for abdominal x-ray shows constipation awaiting for read from radiologist we will notify via telephone, discussed laboratory testing with patient, recommend over-the-counter MiraLAX to be used twice daily until bowel movement occurs then discontinued, recommended good fluid intake and fiber for additional supportive care, given emergency department precautions for worsening abdominal pain, advised PCP follow-up in 2 weeks Final Clinical Impressions(s) / UC Diagnoses   Final diagnoses:  None   Discharge Instructions   None    ED  Prescriptions   None    PDMP not reviewed this encounter.   Valinda Hoar, NP 05/10/23 1143

## 2023-05-10 NOTE — Discharge Instructions (Signed)
Your urinalysis showed Steve Rhodes blood cells and bacteria, it has been sent to the lab to determine exactly what bacteria is present and if need to make changes to your medicine you will be notified  For treatment of your urinary infection take Macrobid every morning and every evening for 7 days  Ensure that you are drinking lots of fluid primarily water to help flush out your kidneys and bladder  If your symptoms continue or get worse please follow-up with urgent care or your primary doctor for reevaluation  I x-ray of your abdomen is pending, on preliminary read by this provider I am able to see that you are constipated I will call you with the results of the final read, if a possible blockage is noted you will be instructed to go to the nearest emergency department for further management  In the meantime purchase over-the-counter MiraLAX, as this medicine is not covered by your insurance and is estimated to be $40 from the pharmacy, take twice daily until you have a full and complete bowel movement then you may stop use of medicine  Ensure that you are drinking lots of water and eating fiber to help bowel movements stay regular  At any point if your abdominal pain worsens in severity please go to the nearest emergency department for immediate evaluation

## 2023-05-12 LAB — URINE CULTURE: Culture: 10000 — AB

## 2023-05-19 ENCOUNTER — Encounter: Payer: Self-pay | Admitting: Family Medicine

## 2023-05-24 ENCOUNTER — Encounter (HOSPITAL_BASED_OUTPATIENT_CLINIC_OR_DEPARTMENT_OTHER): Payer: Self-pay | Admitting: General Surgery

## 2023-05-24 ENCOUNTER — Other Ambulatory Visit: Payer: Self-pay | Admitting: Family Medicine

## 2023-05-24 DIAGNOSIS — R109 Unspecified abdominal pain: Secondary | ICD-10-CM

## 2023-05-28 ENCOUNTER — Ambulatory Visit
Admission: RE | Admit: 2023-05-28 | Discharge: 2023-05-28 | Disposition: A | Discharge status: Home / Self Care | Payer: Medicare PPO | Source: Ambulatory Visit | Attending: Family Medicine | Admitting: Family Medicine

## 2023-05-28 DIAGNOSIS — R109 Unspecified abdominal pain: Secondary | ICD-10-CM | POA: Insufficient documentation

## 2023-05-28 MED ORDER — IOHEXOL 300 MG/ML  SOLN
100.0000 mL | Freq: Once | INTRAMUSCULAR | Status: AC | PRN
  Administered 2023-05-28: 100 mL via INTRAVENOUS

## 2023-06-01 ENCOUNTER — Ambulatory Visit: Admit: 2023-06-01 | Discharge: 2023-06-02 | Payer: MEDICARE

## 2023-06-01 DIAGNOSIS — L578 Other skin changes due to chronic exposure to nonionizing radiation: Principal | ICD-10-CM

## 2023-06-01 DIAGNOSIS — D0439 Carcinoma in situ of skin of other parts of face: Principal | ICD-10-CM

## 2023-06-01 DIAGNOSIS — Z85828 Personal history of other malignant neoplasm of skin: Principal | ICD-10-CM

## 2023-06-01 DIAGNOSIS — L821 Other seborrheic keratosis: Principal | ICD-10-CM

## 2023-06-01 DIAGNOSIS — Z79899 Other long term (current) drug therapy: Principal | ICD-10-CM

## 2023-06-01 DIAGNOSIS — C44319 Basal cell carcinoma of skin of other parts of face: Principal | ICD-10-CM

## 2023-06-01 DIAGNOSIS — L2081 Atopic neurodermatitis: Principal | ICD-10-CM

## 2023-07-27 DIAGNOSIS — L2081 Atopic neurodermatitis: Principal | ICD-10-CM

## 2023-07-27 MED ORDER — METHOTREXATE SODIUM 2.5 MG TABLET
ORAL_TABLET | ORAL | 0 refills | 28.00 days
Start: 2023-07-27 — End: 2023-08-24

## 2023-07-30 MED ORDER — METHOTREXATE SODIUM 2.5 MG TABLET
ORAL_TABLET | 3 refills | 0.00 days | Status: CP
Start: 2023-07-30 — End: ?

## 2023-09-09 ENCOUNTER — Ambulatory Visit: Admit: 2023-09-09 | Discharge: 2023-09-10 | Payer: MEDICARE

## 2023-09-09 DIAGNOSIS — L820 Inflamed seborrheic keratosis: Principal | ICD-10-CM

## 2023-09-09 DIAGNOSIS — L814 Other melanin hyperpigmentation: Principal | ICD-10-CM

## 2023-09-09 DIAGNOSIS — L821 Other seborrheic keratosis: Principal | ICD-10-CM

## 2023-09-09 DIAGNOSIS — Z85828 Personal history of other malignant neoplasm of skin: Principal | ICD-10-CM

## 2023-09-09 DIAGNOSIS — L2081 Atopic neurodermatitis: Principal | ICD-10-CM

## 2023-09-09 DIAGNOSIS — L280 Lichen simplex chronicus: Principal | ICD-10-CM

## 2023-09-09 DIAGNOSIS — Z79899 Other long term (current) drug therapy: Principal | ICD-10-CM

## 2023-09-09 DIAGNOSIS — C44319 Basal cell carcinoma of skin of other parts of face: Principal | ICD-10-CM

## 2023-09-09 DIAGNOSIS — D492 Neoplasm of unspecified behavior of bone, soft tissue, and skin: Principal | ICD-10-CM

## 2023-09-09 MED ORDER — METHOTREXATE SODIUM 2.5 MG TABLET
ORAL_TABLET | 0 refills | 0.00 days | Status: CP
Start: 2023-09-09 — End: ?

## 2023-10-04 DIAGNOSIS — L111 Transient acantholytic dermatosis [Grover]: Principal | ICD-10-CM

## 2023-10-04 MED ORDER — FOLIC ACID 1 MG TABLET
ORAL_TABLET | 0 refills | 0.00 days | Status: CP
Start: 2023-10-04 — End: ?

## 2023-10-14 ENCOUNTER — Ambulatory Visit: Admit: 2023-10-14 | Discharge: 2023-10-15 | Payer: Medicare (Managed Care)

## 2024-01-05 ENCOUNTER — Emergency Department

## 2024-01-05 ENCOUNTER — Other Ambulatory Visit: Payer: Self-pay

## 2024-01-05 ENCOUNTER — Observation Stay
Admission: EM | Admit: 2024-01-05 | Discharge: 2024-01-07 | Disposition: A | Discharge status: Home / Self Care | Attending: Internal Medicine | Admitting: Internal Medicine

## 2024-01-05 DIAGNOSIS — Z85828 Personal history of other malignant neoplasm of skin: Secondary | ICD-10-CM | POA: Insufficient documentation

## 2024-01-05 DIAGNOSIS — Z79899 Other long term (current) drug therapy: Secondary | ICD-10-CM | POA: Diagnosis not present

## 2024-01-05 DIAGNOSIS — Z91040 Latex allergy status: Secondary | ICD-10-CM | POA: Insufficient documentation

## 2024-01-05 DIAGNOSIS — K5900 Constipation, unspecified: Secondary | ICD-10-CM | POA: Diagnosis not present

## 2024-01-05 DIAGNOSIS — R109 Unspecified abdominal pain: Secondary | ICD-10-CM | POA: Diagnosis present

## 2024-01-05 DIAGNOSIS — Z85820 Personal history of malignant melanoma of skin: Secondary | ICD-10-CM

## 2024-01-05 DIAGNOSIS — I10 Essential (primary) hypertension: Secondary | ICD-10-CM | POA: Diagnosis not present

## 2024-01-05 LAB — LIPASE, BLOOD: Lipase: 28 U/L (ref 11–51)

## 2024-01-05 LAB — COMPREHENSIVE METABOLIC PANEL WITH GFR
ALT: 20 U/L (ref 0–44)
AST: 22 U/L (ref 15–41)
Albumin: 3.8 g/dL (ref 3.5–5.0)
Alkaline Phosphatase: 54 U/L (ref 38–126)
Anion gap: 11 (ref 5–15)
BUN: 18 mg/dL (ref 8–23)
CO2: 23 mmol/L (ref 22–32)
Calcium: 10 mg/dL (ref 8.9–10.3)
Chloride: 106 mmol/L (ref 98–111)
Creatinine, Ser: 0.94 mg/dL (ref 0.61–1.24)
GFR, Estimated: >60 mL/min (ref >60)
Glucose, Bld: 144 mg/dL — ABNORMAL HIGH (ref 70–99)
Potassium: 4 mmol/L (ref 3.5–5.1)
Sodium: 140 mmol/L (ref 135–145)
Total Bilirubin: 0.7 mg/dL (ref 0.0–1.2)
Total Protein: 7.6 g/dL (ref 6.5–8.1)

## 2024-01-05 LAB — CBC
HCT: 44.4 % (ref 39.0–52.0)
Hemoglobin: 14.5 g/dL (ref 13.0–17.0)
MCH: 32 pg (ref 26.0–34.0)
MCHC: 32.7 g/dL (ref 30.0–36.0)
MCV: 98 fL (ref 80.0–100.0)
Platelets: 303 K/uL (ref 150–400)
RBC: 4.53 MIL/uL (ref 4.22–5.81)
RDW: 13.5 % (ref 11.5–15.5)
WBC: 12.5 K/uL — ABNORMAL HIGH (ref 4.0–10.5)
nRBC: 0 % (ref 0.0–0.2)

## 2024-01-05 MED ORDER — FLEET ENEMA RE ENEM
1.0000 | ENEMA | Freq: Once | RECTAL | Status: AC
  Administered 2024-01-05: 1 via RECTAL

## 2024-01-05 MED ORDER — IOHEXOL 300 MG/ML  SOLN
100.0000 mL | Freq: Once | INTRAMUSCULAR | Status: AC | PRN
  Administered 2024-01-05: 100 mL via INTRAVENOUS

## 2024-01-05 MED ORDER — POLYETHYLENE GLYCOL 3350 17 G PO PACK: 17.0000 g | PACK | Freq: Every day | ORAL | 0 refills | Status: AC

## 2024-01-05 MED ORDER — DOCUSATE SODIUM 100 MG PO CAPS: 100.0000 mg | ORAL_CAPSULE | Freq: Every day | ORAL | 0 refills | Status: AC

## 2024-01-05 MED ORDER — MORPHINE SULFATE (PF) 4 MG/ML IV SOLN
4.0000 mg | Freq: Once | INTRAVENOUS | Status: AC
  Administered 2024-01-05: 4 mg via INTRAVENOUS
  Filled 2024-01-05: qty 1

## 2024-01-05 MED ORDER — GLYCERIN (ADULT) 2 G RE SUPP: 1.0000 | RECTAL | 0 refills | Status: AC | PRN

## 2024-01-05 NOTE — ED Triage Notes (Signed)
 Arrives via [facility].  C/O abd pain x 3 weeks.  Concerned he is impacted.  Last good BM 3 weeks ago, now just moving small 'pebbles',  VS wnl.

## 2024-01-05 NOTE — ED Notes (Addendum)
 This tech assisted pt to restroom in triage. Pt able to get up independently but not able to provide urine sample at this time.

## 2024-01-05 NOTE — ED Provider Notes (Signed)
 [facility] Provider Note    Event Date/Time   First MD Initiated Contact with Patient 01/05/24 2218     (approximate)   History   Abdominal Pain  Arrives via [facility].  C/O abd pain x 3 weeks.  Concerned he is impacted.  Last good BM 3 weeks ago, now just moving small 'pebbles',  VS wnl.  Pt to ED via [facility]. Pt reports lower abd pain and constipation x3 wks. Pt reports is passing very small pieces. Pt denies N/V or fevers.    HPI Steve Rhodes. is a 83 y.o. male PMH of GERD, hypertension, hyperlipidemia presents for evaluation of constipation, rectal discomfort - Patient states for about 3 weeks he has not been able to have a normal bowel movement, notes he is only able to get out small pebbles and occasional diarrhea.  Has been refractory to senna. - Contrary to triage note, denies any significant abdominal pain on my eval but notes he has some discomfort when he attempts to strain. -No fever -Had recently been treated for urinary tract infection, no ongoing urinary symptoms.     Physical Exam   Triage Vital Signs: ED Triage Vitals [01/05/24 1704]  Encounter Vitals Group     BP (!) 167/97     Girls Systolic BP Percentile      Girls Diastolic BP Percentile      Boys Systolic BP Percentile      Boys Diastolic BP Percentile      Pulse Rate (!) 124     Resp 20     Temp 98.1 F (36.7 C)     Temp Source Oral     SpO2 98 %     Weight      Height      Head Circumference      Peak Flow      Pain Score 7     Pain Loc      Pain Education      Exclude from Growth Chart     Most recent vital signs: Vitals:   01/05/24 2223 01/05/24 2223  BP: (!) 148/74   Pulse: 93   Resp: 18   Temp: 98.1 F (36.7 C)   SpO2: 98% 98%     General: Awake, no distress.  CV:  Good peripheral perfusion. RRR, RP 2+ Resp:  Normal effort. CTAB Abd:  Appears mildly distended though nontender to deep palpation throughout. Rectal:  Patient did have  watery stool in his underwear, rectal exam with large firm stool mass in the proximal rectal vault.  No fissures appreciated.  No blood.  No masses.    ED Results / Procedures / Treatments   Labs (all labs ordered are listed, but only abnormal results are displayed) Labs Reviewed  COMPREHENSIVE METABOLIC PANEL WITH GFR - Abnormal; Notable for the following components:      Result Value   Glucose, Bld 144 (*)    All other components within normal limits  CBC - Abnormal; Notable for the following components:   WBC 12.5 (*)    All other components within normal limits  LIPASE, BLOOD  URINALYSIS, ROUTINE W REFLEX MICROSCOPIC     EKG  N/a   RADIOLOGY X-ray interpreted by myself and radiology report reviewed --notable stool in colon.  CT abdomen pelvis pending.    PROCEDURES:  Critical Care performed: No  .Fecal disimpaction  Date/Time: 01/05/2024 10:36 PM  Performed by: Clarine Ozell DELENA, MD Authorized by: Clarine,  Ozell LABOR, MD  Consent: Verbal consent obtained Risks and benefits: risks, benefits and alternatives were discussed Consent given by: patient Patient understanding: patient states understanding of the procedure being performed Test results: test results available and properly labeled Imaging studies: imaging studies available Patient identity confirmed: verbally with patient and arm band Time out: Immediately prior to procedure a time out was called to verify the correct patient, procedure, equipment, support staff and site/side marked as required. Preparation: Patient was prepped and draped in the usual sterile fashion. Local anesthesia used: no  Anesthesia: Local anesthesia used: no  Sedation: Patient sedated: no  Comments: Patient only tolerated mild amount of disimpaction, requested termination of exam on initial attempt due to discomfort.  Large stool ball in proximal rectal vault.      MEDICATIONS ORDERED IN ED: Medications  iohexol   (OMNIPAQUE ) 300 MG/ML solution 100 mL (has no administration in time range)  morphine  (PF) 4 MG/ML injection 4 mg (4 mg Intravenous Given 01/05/24 2242)  sodium phosphate (FLEET) enema 1 enema (1 enema Rectal Given 01/05/24 2242)     IMPRESSION / MDM / ASSESSMENT AND PLAN / ED COURSE  I reviewed the triage vital signs and the nursing notes.                              DDX/MDM/AP: Differential diagnosis includes, but is not limited to, likely uncomplicated constipation, doubt underlying obstruction or mass given no abdominal discomfort to deep palpation throughout here and patient continues to pass small amounts of stool, rectal exam does have large stool ball in this, suspect he may be having some episodes of encopresis associated with it.  Do not clinically suspect appendicitis, diverticulitis at this time though consider if patient did not have resolution of symptoms with passage of stool.  Plan: - Fecal disimpaction -- only tolerated about 10 seconds before requesting stopping due to discomfort.  Large stool ball in rectal vault.  No masses or blood appreciated. - Will place IV for morphine  and reattempt - Will also trial Fleet enema  Patient's presentation is most consistent with acute complicated illness / injury requiring diagnostic workup.  ED course below.  Patient with no bowel movement despite attempted manual disimpaction as well as Fleet enema with pain control.  Escalating to CT abdomen pelvis.  Signed out to oncoming ED provider Dr. Josepha pending CT abdomen pelvis.  If unremarkable and patient is well, I believe it would be reasonable to discharge home with stool regimen--have prescribed Colace, glycerin  suppositories, MiraLAX .  If patient is not feeling well enough for this can also consider admission for further evaluation and possible GI intervention as needed.  Clinical Course as of 01/05/24 2346  Wed Jan 05, 2024  2220 CBC with mild leukocytosis, CMP unremarkable [MM]   2220 Lipase normal. [MM]  2220 KUB: IMPRESSION: Large amount of stool throughout the entire colon and within the rectum.   [MM]  2334 Patient with no bowel movement after enema, only some liquid stool.  Will escalate to CT abdomen pelvis. [MM]    Clinical Course User Index [MM] Clarine Ozell LABOR, MD     FINAL CLINICAL IMPRESSION(S) / ED DIAGNOSES   Final diagnoses:  Constipation, unspecified constipation type     Rx / DC Orders   ED Discharge Orders          Ordered    polyethylene glycol (MIRALAX ) 17 g packet  Daily  01/05/24 2344    glycerin  adult 2 g suppository  As needed        01/05/24 2344    docusate sodium  (COLACE) 100 MG capsule  Daily        01/05/24 2344             Note:  This document was prepared using Dragon voice recognition software and may include unintentional dictation errors.   Clarine Ozell LABOR, MD 01/05/24 [phone]

## 2024-01-05 NOTE — Discharge Instructions (Addendum)
 Your evaluation in the emergency department was overall reassuring.  We have prescribed you multiple constipation medications.  Please do follow-up with your primary care provider for reevaluation, and return to the emergency department with any new or worsening symptoms.

## 2024-01-05 NOTE — ED Triage Notes (Signed)
 Pt to ED via [facility]. Pt reports lower abd pain and constipation x3 wks. Pt reports is passing very small pieces. Pt denies N/V or fevers.

## 2024-01-06 DIAGNOSIS — I10 Essential (primary) hypertension: Secondary | ICD-10-CM | POA: Insufficient documentation

## 2024-01-06 DIAGNOSIS — K5900 Constipation, unspecified: Secondary | ICD-10-CM | POA: Diagnosis present

## 2024-01-06 DIAGNOSIS — Z85820 Personal history of malignant melanoma of skin: Secondary | ICD-10-CM

## 2024-01-06 MED ORDER — BISACODYL 5 MG PO TBEC
10.0000 mg | DELAYED_RELEASE_TABLET | Freq: Once | ORAL | Status: AC
  Administered 2024-01-06: 10 mg via ORAL
  Filled 2024-01-06: qty 2

## 2024-01-06 MED ORDER — ACETAMINOPHEN 325 MG PO TABS: 650.0000 mg | ORAL_TABLET | Freq: Four times a day (QID) | ORAL | Status: DC | PRN

## 2024-01-06 MED ORDER — HYDROCHLOROTHIAZIDE 25 MG PO TABS
25.0000 mg | ORAL_TABLET | Freq: Every day | ORAL | Status: DC
  Administered 2024-01-06 – 2024-01-07 (×2): 25 mg via ORAL
  Filled 2024-01-06 (×2): qty 1

## 2024-01-06 MED ORDER — ONDANSETRON HCL 4 MG PO TABS: 4.0000 mg | ORAL_TABLET | Freq: Four times a day (QID) | ORAL | Status: DC | PRN

## 2024-01-06 MED ORDER — ENOXAPARIN SODIUM 40 MG/0.4ML IJ SOSY
40.0000 mg | PREFILLED_SYRINGE | INTRAMUSCULAR | Status: DC
  Administered 2024-01-06 – 2024-01-07 (×2): 40 mg via SUBCUTANEOUS
  Filled 2024-01-06 (×2): qty 0.4

## 2024-01-06 MED ORDER — SODIUM CHLORIDE 0.9 % IV SOLN
1.0000 g | Freq: Once | INTRAVENOUS | Status: AC
  Administered 2024-01-06: 1 g via INTRAVENOUS
  Filled 2024-01-06: qty 10

## 2024-01-06 MED ORDER — ATORVASTATIN CALCIUM 10 MG PO TABS
10.0000 mg | ORAL_TABLET | Freq: Every day | ORAL | Status: DC
  Administered 2024-01-06 – 2024-01-07 (×2): 10 mg via ORAL
  Filled 2024-01-06 (×2): qty 1

## 2024-01-06 MED ORDER — METRONIDAZOLE 500 MG/100ML IV SOLN
500.0000 mg | Freq: Once | INTRAVENOUS | Status: AC
Start: 2024-01-06 — End: 2024-01-06
  Administered 2024-01-06: 500 mg via INTRAVENOUS
  Filled 2024-01-06: qty 100

## 2024-01-06 MED ORDER — POLYETHYLENE GLYCOL 3350 17 G PO PACK
17.0000 g | PACK | Freq: Every day | ORAL | Status: DC
  Administered 2024-01-06 – 2024-01-07 (×2): 17 g via ORAL
  Filled 2024-01-06 (×2): qty 1

## 2024-01-06 MED ORDER — SENNA 8.6 MG PO TABS
1.0000 | ORAL_TABLET | Freq: Two times a day (BID) | ORAL | Status: DC
  Administered 2024-01-06 – 2024-01-07 (×3): 8.6 mg via ORAL
  Filled 2024-01-06 (×3): qty 1

## 2024-01-06 MED ORDER — MELATONIN 5 MG PO TABS
5.0000 mg | ORAL_TABLET | Freq: Every evening | ORAL | Status: DC | PRN
  Administered 2024-01-06: 5 mg via ORAL
  Filled 2024-01-06: qty 1

## 2024-01-06 MED ORDER — ONDANSETRON HCL 4 MG/2ML IJ SOLN: 4.0000 mg | Freq: Four times a day (QID) | INTRAMUSCULAR | Status: DC | PRN

## 2024-01-06 MED ORDER — LISINOPRIL-HYDROCHLOROTHIAZIDE 20-25 MG PO TABS: 1.0000 | ORAL_TABLET | Freq: Every day | ORAL | Status: DC

## 2024-01-06 MED ORDER — MAGNESIUM CITRATE PO SOLN
1.0000 | Freq: Once | ORAL | Status: DC
  Filled 2024-01-06: qty 296

## 2024-01-06 MED ORDER — LISINOPRIL 20 MG PO TABS
20.0000 mg | ORAL_TABLET | Freq: Every day | ORAL | Status: DC
  Administered 2024-01-06 – 2024-01-07 (×2): 20 mg via ORAL
  Filled 2024-01-06: qty 2
  Filled 2024-01-06: qty 1

## 2024-01-06 MED ORDER — ACETAMINOPHEN 650 MG RE SUPP: 650.0000 mg | Freq: Four times a day (QID) | RECTAL | Status: DC | PRN

## 2024-01-06 NOTE — Progress Notes (Addendum)
 Mr. Steve Rhodes is an 83 year old man with medical history significant for melanoma, chronic gastritis, Barrett's esophagus, BPH, hypertension, insomnia, who presented to the hospital with constipation and abdominal pain.  CT abdomen pelvis showed constipation with large stool burden throughout the colon including a large stool ball in the rectum.   He was admitted to the hospital with constipation with fecal impaction and possible stercoral colitis.  He was started on laxatives including Dulcolax, MiraLAX , senna and Fleet enema.   He was seen and examined at the bedside this morning.  He is feeling better.  He said he has had about 3 bowel movements because his diaper has been changed about 3 times.  He thinks he is getting some good results with the laxatives but he still has a long way to go.  Abdominal pain has improved.  Continue laxatives as needed. He said he has had multiple colonoscopies in the past. Plan discussed with patient and his wife at the bedside.  Plan to discharge home tomorrow.

## 2024-01-06 NOTE — Assessment & Plan Note (Signed)
 Continue lisinopril

## 2024-01-06 NOTE — ED Notes (Signed)
 This tech cleaned pt after bowel movement. Pt cleaned and new sheets and chucks were given. No other needs at this time. Call light within reach.

## 2024-01-06 NOTE — ED Notes (Signed)
 Pt had small watery BM. Pericare provided and new brief applied. Assisted to use urinal in bed. Pt had urinary output of 500 mL.

## 2024-01-06 NOTE — H&P (Signed)
 History and Physical    Patient: Steve Rhodes. FMW:[id number] DOB: 29-Apr-1941 DOA: 01/05/2024 DOS: the patient was seen and examined on 01/06/2024 PCP: [name], MD  Patient coming from: Home  Chief Complaint:  Chief Complaint  Patient presents with   Abdominal Pain    HPI: Steve Rhodes. is a 83 y.o. male with medical history significant for Melanoma, chronic gastritis, Barrett's esophagus, BPH, HTN, insomnia being admitted with constipation and stercoral colitis.  Patient has chronic baseline constipation and his stool is usually small hard pieces.  He has not had a bowel movement in over 3 weeks and his abdomen is distended and he was concerned about a possible obstruction.  He denies nausea or vomiting.  He denies fever or chills. In the ED, mildly tachycardic to 124, BP 167/97 with otherwise normal vitals. Labs notable for leukocytosis of 12,500 but otherwise unremarkable CBC, CMP and lipase.CT abdomen and pelvis showing the following Constipation with large stool burden throughout the colon including a large stool ball in the rectum where there is wall thickening and adjacent stranding. Correlate for fecal impaction and developing stercoral colitis Fecal disimpaction was attempted in the ED but stool ball could not be reached.  He was treated with an enema.  After 2 to 3 hours patient had passed only a few pellets.  Admission requested.     Review of Systems: As mentioned in the history of present illness. All other systems reviewed and are negative.  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   Cancer [facility])    skin cancer   Colon polyp 04/2015   TUBULAR ADENOMA   GERD (gastroesophageal reflux disease)    Grover's disease    Hyperlipidemia    Hypertension    Mitral valve disorder    Past Surgical History:  Procedure Laterality Date   CARDIAC CATHETERIZATION Left 01/28/2016   Procedure: Left Heart Cath and Coronary Angiography;  Surgeon:  Vinie DELENA Jude, MD;  Location: [facility] INVASIVE CV LAB;  Service: Cardiovascular;  Laterality: Left;   COLONOSCOPY WITH PROPOFOL  N/A 05/10/2015   Procedure: COLONOSCOPY WITH PROPOFOL ;  Surgeon: Deward CINDERELLA Piedmont, MD;  Location: [facility] ENDOSCOPY;  Service: Gastroenterology;  Laterality: N/A;   ESOPHAGOGASTRODUODENOSCOPY (EGD) WITH PROPOFOL  N/A 05/10/2015   Procedure: ESOPHAGOGASTRODUODENOSCOPY (EGD) WITH PROPOFOL ;  Surgeon: Deward CINDERELLA Piedmont, MD;  Location: [facility] ENDOSCOPY;  Service: Gastroenterology;  Laterality: N/A;   ESOPHAGOGASTRODUODENOSCOPY (EGD) WITH PROPOFOL  N/A 06/10/2015   Procedure: ESOPHAGOGASTRODUODENOSCOPY (EGD) WITH PROPOFOL ;  Surgeon: Deward CINDERELLA Piedmont, MD;  Location: [facility] ENDOSCOPY;  Service: Gastroenterology;  Laterality: N/A;   ESOPHAGOGASTRODUODENOSCOPY (EGD) WITH PROPOFOL  N/A 06/29/2016   Procedure: ESOPHAGOGASTRODUODENOSCOPY (EGD) WITH PROPOFOL ;  Surgeon: Gladis RAYMOND Mariner, MD;  Location: [facility] ENDOSCOPY;  Service: Endoscopy;  Laterality: N/A;   SKIN CANCER EXCISION     on arms   Social History:  reports that he has never smoked. He has never used smokeless tobacco. He reports that he does not drink alcohol and does not use drugs.  Allergies  Allergen Reactions   Latex Hives and Rash   Sulfa Antibiotics Other (See Comments)    Reaction in childhood    Family History  Problem Relation Age of Onset   Colon cancer Neg Hx     Prior to Admission medications   Medication Sig Start Date End Date Taking? Authorizing Provider  docusate sodium  (COLACE) 100 MG capsule Take 1 capsule (100 mg total) by mouth daily for 14 days. 01/05/24 01/19/24 Yes  Clarine Ozell LABOR, MD  glycerin  adult 2 g suppository Place 1 suppository rectally as needed for constipation. 01/05/24  Yes Clarine Ozell LABOR, MD  polyethylene glycol (MIRALAX ) 17 g packet Take 17 g by mouth daily. 01/05/24  Yes Clarine Ozell LABOR, MD  atorvastatin  (LIPITOR) 10 MG tablet Take 10 mg by mouth daily.    [provider]  cetirizine (ZYRTEC) 10 MG  tablet Take 10 mg by mouth daily.    [provider]  diphenoxylate -atropine  (LOMOTIL ) 2.5-0.025 MG tablet Take 1 tablet by mouth 4 (four) times daily as needed for diarrhea or loose stools. 08/08/20   Cook, Jayce G, DO  doxepin (SINEQUAN) 10 MG capsule Take 10 mg by mouth.    [provider]  folic acid (FOLVITE) 1 MG tablet TAKE 3 TABLETS BY MOUTH ONCE DAILY ON  YOUR  NON  METHOTREXATE  DAYS 11/07/19   [provider]  lisinopril -hydrochlorothiazide  (PRINZIDE ,ZESTORETIC ) 20-25 MG tablet Take 1 tablet by mouth daily.    [provider]  metFORMIN (GLUCOPHAGE) 500 MG tablet Take 500 mg by mouth daily. 11/29/19   [provider]  methotrexate 2.5 MG tablet TAKE 5 TABLETS BY MOUTH ONCE A WEEK 01/01/20   [provider]  oxybutynin (DITROPAN) 5 MG tablet Take 5 mg by mouth at bedtime.    [provider]  pantoprazole (PROTONIX) 40 MG tablet Take 40 mg by mouth daily.    [provider]  triamcinolone cream (KENALOG) 0.1 % Apply 1 application topically as needed (for rashy areas).    [provider]  valACYclovir (VALTREX) 1000 MG tablet Take 1,000 mg by mouth 2 (two) times daily.    [provider]  zolpidem (AMBIEN) 10 MG tablet Take 10 mg by mouth at bedtime as needed for sleep.    [provider]    Physical Exam: Vitals:   01/05/24 2223 01/05/24 2223 01/06/24 0132 01/06/24 0230  BP: (!) 148/74  128/68   Pulse: 93  74   Resp: 18  18   Temp: 98.1 F (36.7 C)   98.3 F (36.8 C)  TempSrc: Axillary   Oral  SpO2: 98% 98% 94%    Physical Exam Vitals and nursing note reviewed.  Constitutional:      General: He is not in acute distress. HENT:     Head: Normocephalic and atraumatic.  Cardiovascular:     Rate and Rhythm: Normal rate and regular rhythm.     Heart sounds: Normal heart sounds.  Pulmonary:     Effort: Pulmonary effort is normal.     Breath sounds: Normal breath sounds.  Abdominal:      General: There is distension.     Palpations: Abdomen is soft.     Tenderness: There is no abdominal tenderness.  Neurological:     Mental Status: Mental status is at baseline.     Labs on Admission: I have personally reviewed following labs and imaging studies  CBC: Recent Labs  Lab 01/05/24 1705  WBC 12.5*  HGB 14.5  HCT 44.4  MCV 98.0  PLT 303   Basic Metabolic Panel: Recent Labs  Lab 01/05/24 1705  NA 140  K 4.0  CL 106  CO2 23  GLUCOSE 144*  BUN 18  CREATININE 0.94  CALCIUM  10.0   GFR: CrCl cannot be calculated (Unknown ideal weight.). Liver Function Tests: Recent Labs  Lab 01/05/24 1705  AST 22  ALT 20  ALKPHOS 54  BILITOT 0.7  PROT 7.6  ALBUMIN  3.8   Recent Labs  Lab 01/05/24 1705  LIPASE 28   No results for input(s): AMMONIA in the last 168 hours. Coagulation Profile: No results for input(s): INR, PROTIME in the last 168 hours. Cardiac Enzymes: No results for input(s): CKTOTAL, CKMB, CKMBINDEX, TROPONINI in the last 168 hours. BNP (last 3 results) No results for input(s): PROBNP in the last 8760 hours. HbA1C: No results for input(s): HGBA1C in the last 72 hours. CBG: No results for input(s): GLUCAP in the last 168 hours. Lipid Profile: No results for input(s): CHOL, HDL, LDLCALC, TRIG, CHOLHDL, LDLDIRECT in the last 72 hours. Thyroid Function Tests: No results for input(s): TSH, T4TOTAL, FREET4, T3FREE, THYROIDAB in the last 72 hours. Anemia Panel: No results for input(s): VITAMINB12, FOLATE, FERRITIN, TIBC, IRON, RETICCTPCT in the last 72 hours. Urine analysis:    Component Value Date/Time   COLORURINE AMBER (A) 05/10/2023 1017   APPEARANCEUR CLEAR 05/10/2023 1017   APPEARANCEUR CLEAR 08/12/2012 1002   LABSPEC 1.020 05/10/2023 1017   LABSPEC 1.025 08/12/2012 1002   PHURINE 5.5 05/10/2023 1017   GLUCOSEU NEGATIVE 05/10/2023 1017   GLUCOSEU NEGATIVE 08/12/2012 1002   HGBUR  NEGATIVE 05/10/2023 1017   BILIRUBINUR SMALL (A) 05/10/2023 1017   BILIRUBINUR NEGATIVE 08/12/2012 1002   KETONESUR NEGATIVE 05/10/2023 1017   PROTEINUR 30 (A) 05/10/2023 1017   NITRITE NEGATIVE 05/10/2023 1017   LEUKOCYTESUR TRACE (A) 05/10/2023 1017   LEUKOCYTESUR NEGATIVE 08/12/2012 1002    Radiological Exams on Admission: CT ABDOMEN PELVIS W CONTRAST Result Date: 01/06/2024 EXAM: CT ABDOMEN AND PELVIS WITH CONTRAST 01/06/2024 12:00:37 AM TECHNIQUE: CT of the abdomen and pelvis was performed with the administration of iohexol  (OMNIPAQUE ) 300 MG/ML solution. Multiplanar reformatted images are provided for review. Automated exposure control, iterative reconstruction, and/or weight based adjustment of the mA/kV was utilized to reduce the radiation dose to as low as reasonably achievable. COMPARISON: CT abdomen and pelvis dated 05/28/2023 and x-ray dated 01/05/2024. CLINICAL HISTORY: Constipation x 3 weeks, eval for obstruction, other pathology. Per ED notes; Arrives via [facility]. C/O abd pain x 3 weeks. Concerned he is impacted. Last good BM 3 weeks ago, now just moving small 'pebbles'. FINDINGS: LOWER CHEST: No acute abnormality. LIVER: The liver is unremarkable. GALLBLADDER AND BILE DUCTS: Gallbladder is unremarkable. No biliary ductal dilatation. SPLEEN: No acute abnormality. PANCREAS: No acute abnormality. ADRENAL GLANDS: No acute abnormality. KIDNEYS, URETERS AND BLADDER: No stones in the kidneys or ureters. No hydronephrosis. No perinephric or periureteral stranding. Urinary bladder is unremarkable. GI AND BOWEL: Large colonic stool burden throughout the colon. Large stool ball in the rectum with mild rectal wall thickening and trace adjacent stranding. Normal appendix. PERITONEUM AND RETROPERITONEUM: No ascites. No free air. VASCULATURE: Aorta is normal in caliber. LYMPH NODES: No lymphadenopathy. REPRODUCTIVE ORGANS: No acute abnormality. BONES AND SOFT TISSUES: No acute osseous  abnormality. No focal soft tissue abnormality. IMPRESSION: 1. Constipation with large stool burden throughout the colon including a large stool ball in the rectum where there is wall thickening and adjacent stranding. Correlate for fecal impaction and developing stercoral colitis. Electronically signed by: Norman Gatlin MD 01/06/2024 12:09 AM EDT RP Workstation: [id number]   DG Abd 1 View Result Date: 01/05/2024 CLINICAL DATA:  Constipation EXAM: ABDOMEN - 1 VIEW COMPARISON:  Abdominal pain 05/10/2023 FINDINGS: The bowel gas pattern is normal. There is a large amount of stool throughout the entire colon and within the rectum. No radio-opaque calculi or other significant radiographic abnormality are seen. IMPRESSION: Large  amount of stool throughout the entire colon and within the rectum. Electronically Signed   By: Greig Pique M.D.   On: 01/05/2024 17:59   Data Reviewed for HPI: Relevant notes from primary care and specialist visits, past discharge summaries as available in EHR, including Care Everywhere. Prior diagnostic testing as pertinent to current admission diagnoses Updated medications and problem lists for reconciliation ED course, including vitals, labs, imaging, treatment and response to treatment Triage notes, nursing and pharmacy notes and ED provider's notes Notable results as noted above in HPI      Assessment and Plan: * Constipation Possible stercoral colitis Received an enema in the ED Will try magnesium  citrate and dulcolax and daily miralax  Holding off on antibiotics for now as patient denies pain  History of melanoma No acute issues Follows regularly with dermatology  Hypertension Continue lisinopril     DVT prophylaxis: Lovenox   Consults: none  Advance Care Planning:   Code Status: Prior   Family Communication: wife at bedside  Disposition Plan: Back to previous home environment  Severity of Illness: The appropriate patient status for this patient is  OBSERVATION. Observation status is judged to be reasonable and necessary in order to provide the required intensity of service to ensure the patient's safety. The patient's presenting symptoms, physical exam findings, and initial radiographic and laboratory data in the context of their medical condition is felt to place them at decreased risk for further clinical deterioration. Furthermore, it is anticipated that the patient will be medically stable for discharge from the hospital within 2 midnights of admission.   Author: Delayne LULLA Solian, MD 01/06/2024 3:37 AM  For on call review [email or web address].

## 2024-01-06 NOTE — Assessment & Plan Note (Signed)
 No acute issues Follows regularly with dermatology

## 2024-01-06 NOTE — Assessment & Plan Note (Addendum)
 Possible stercoral colitis Received an enema in the ED Will try magnesium  citrate and dulcolax and daily miralax  Holding off on antibiotics for now as patient denies pain

## 2024-01-06 NOTE — ED Provider Notes (Signed)
 Ongoing encopresis but no good bowel movement after initial treatment.  CT with large stool burden and developing stercoral colitis so given intra-abdominal coverage IV antibiotics and admission.   Cyrena Mylar, MD 01/06/24 [phone]

## 2024-01-07 DIAGNOSIS — K5900 Constipation, unspecified: Secondary | ICD-10-CM | POA: Diagnosis not present

## 2024-01-07 MED ORDER — GERHARDT'S BUTT CREAM
TOPICAL_CREAM | Freq: Two times a day (BID) | CUTANEOUS | Status: DC
  Filled 2024-01-07: qty 60

## 2024-01-07 MED ORDER — GERHARDT'S BUTT CREAM: 1.0000 | TOPICAL_CREAM | Freq: Two times a day (BID) | CUTANEOUS | Status: AC

## 2024-01-07 NOTE — Discharge Summary (Signed)
 Physician Discharge Summary   Patient: Steve Rhodes. MRN: [id number] DOB: Jul 26, 1940  Admit date:     01/05/2024  Discharge date: 01/07/24  Discharge Physician: AIDA CHO   PCP: [name], MD   Recommendations at discharge:   Follow-up with PCP in 1 to 2 weeks  Discharge Diagnoses: Principal Problem:   Constipation Active Problems:   Hypertension   History of melanoma  Resolved Problems:   * No resolved hospital problems. [facility] Course:  Steve Rhodes is an 83 year old man with medical history significant for melanoma, chronic gastritis, Barrett's esophagus, BPH, hypertension, insomnia, who presented to the hospital with constipation and abdominal pain.  CT abdomen pelvis showed constipation with large stool burden throughout the colon including a large stool ball in the rectum.   He was admitted to the hospital with constipation with fecal impaction and possible stercoral colitis.  He was started on laxatives including Dulcolax, MiraLAX , senna and Fleet enema.   He had good results with laxatives and was able to have multiple bowel movements.  Abdominal pain and constipation have resolved.  Patient has been advised to use preferred laxatives as needed to prevent recurrence of significant constipation fecal impaction.  He said he had multiple colonoscopies in the past.  However, screening colonoscopy was discontinued because of advanced age. His condition has improved and he is deemed stable for discharge home today.  Discharge plan was discussed with the patient, his wife and [name], daughter, at the bedside.  We went over all his discharge medications.  He indicated the medicines he still takes and the ones he no longer takes.       Consultants: None Procedures performed: None Disposition: Home Diet recommendation:  Discharge Diet Orders (From admission, onward)     Start     Ordered   01/07/24 0000  Diet - low sodium heart healthy        01/07/24 1131            Cardiac diet DISCHARGE MEDICATION: Allergies as of 01/07/2024       Reactions   Latex Hives, Rash   Sulfa Antibiotics Other (See Comments)   Reaction in childhood        Medication List     STOP taking these medications    amoxicillin-clavulanate 875-125 MG tablet Commonly known as: AUGMENTIN   cetirizine 10 MG tablet Commonly known as: ZYRTEC   diphenoxylate -atropine  2.5-0.025 MG tablet Commonly known as: Lomotil    metFORMIN 500 MG tablet Commonly known as: GLUCOPHAGE   methotrexate 2.5 MG tablet Commonly known as: RHEUMATREX   pantoprazole 40 MG tablet Commonly known as: PROTONIX   tamsulosin 0.4 MG Caps capsule Commonly known as: FLOMAX   triamcinolone cream 0.1 % Commonly known as: KENALOG   valACYclovir 1000 MG tablet Commonly known as: VALTREX       TAKE these medications    atorvastatin  10 MG tablet Commonly known as: LIPITOR Take 10 mg by mouth daily.   docusate sodium  100 MG capsule Commonly known as: Colace Take 1 capsule (100 mg total) by mouth daily for 14 days.   doxepin 10 MG capsule Commonly known as: SINEQUAN Take 10 mg by mouth.   folic acid 1 MG tablet Commonly known as: FOLVITE TAKE 3 TABLETS BY MOUTH ONCE DAILY ON  YOUR  NON  METHOTREXATE  DAYS   Gerhardt's butt cream Crea Apply 1 Application topically 2 (two) times daily for 5 days.   glycerin  adult 2 g suppository  Place 1 suppository rectally as needed for constipation.   lisinopril -hydrochlorothiazide  20-25 MG tablet Commonly known as: ZESTORETIC  Take 1 tablet by mouth daily.   omeprazole 40 MG capsule Commonly known as: PRILOSEC Take 40 mg by mouth daily.   oxybutynin 5 MG tablet Commonly known as: DITROPAN Take 5 mg by mouth at bedtime.   polyethylene glycol 17 g packet Commonly known as: MiraLax  Take 17 g by mouth daily.   propranolol 20 MG tablet Commonly known as: INDERAL Take 20 mg by mouth 2 (two) times daily.   zolpidem 10 MG  tablet Commonly known as: AMBIEN Take 10 mg by mouth at bedtime as needed for sleep.        Follow-up Information     Schedule an appointment as soon as possible for a visit  with [name], MD.   Specialty: Family Medicine Contact information: [address] [place] [address] [phone]         Go to  [facility] Emergency Department at [facility]   Specialty: Emergency Medicine Why: As needed, If symptoms worsen Contact information: [address] [place]  [address] [phone]               Discharge Exam: [name]   01/06/24 0420  Weight: 90.7 kg   GEN: NAD SKIN: Warm and dry EYES: No pallor or icterus ENT: MMM CV: RRR PULM: CTA B ABD: soft, abdominal distention has improved, NT, +BS CNS: AAO x 3, non focal EXT: No edema or tenderness   Condition at discharge: good  The results of significant diagnostics from this hospitalization (including imaging, microbiology, ancillary and laboratory) are listed below for reference.   Imaging Studies: CT ABDOMEN PELVIS W CONTRAST Result Date: 01/06/2024 EXAM: CT ABDOMEN AND PELVIS WITH CONTRAST 01/06/2024 12:00:37 AM TECHNIQUE: CT of the abdomen and pelvis was performed with the administration of iohexol  (OMNIPAQUE ) 300 MG/ML solution. Multiplanar reformatted images are provided for review. Automated exposure control, iterative reconstruction, and/or weight based adjustment of the mA/kV was utilized to reduce the radiation dose to as low as reasonably achievable. COMPARISON: CT abdomen and pelvis dated 05/28/2023 and x-ray dated 01/05/2024. CLINICAL HISTORY: Constipation x 3 weeks, eval for obstruction, other pathology. Per ED notes; Arrives via [facility]. C/O abd pain x 3 weeks. Concerned he is impacted. Last good BM 3 weeks ago, now just moving small 'pebbles'. FINDINGS: LOWER CHEST: No acute abnormality. LIVER: The liver is unremarkable. GALLBLADDER AND  BILE DUCTS: Gallbladder is unremarkable. No biliary ductal dilatation. SPLEEN: No acute abnormality. PANCREAS: No acute abnormality. ADRENAL GLANDS: No acute abnormality. KIDNEYS, URETERS AND BLADDER: No stones in the kidneys or ureters. No hydronephrosis. No perinephric or periureteral stranding. Urinary bladder is unremarkable. GI AND BOWEL: Large colonic stool burden throughout the colon. Large stool ball in the rectum with mild rectal wall thickening and trace adjacent stranding. Normal appendix. PERITONEUM AND RETROPERITONEUM: No ascites. No free air. VASCULATURE: Aorta is normal in caliber. LYMPH NODES: No lymphadenopathy. REPRODUCTIVE ORGANS: No acute abnormality. BONES AND SOFT TISSUES: No acute osseous abnormality. No focal soft tissue abnormality. IMPRESSION: 1. Constipation with large stool burden throughout the colon including a large stool ball in the rectum where there is wall thickening and adjacent stranding. Correlate for fecal impaction and developing stercoral colitis. Electronically signed by: Norman Gatlin MD 01/06/2024 12:09 AM EDT RP Workstation: [id number]   DG Abd 1 View Result Date: 01/05/2024 CLINICAL DATA:  Constipation EXAM: ABDOMEN - 1 VIEW COMPARISON:  Abdominal pain 05/10/2023 FINDINGS: The bowel gas pattern is normal. There is a large amount of stool throughout the entire colon and within the rectum. No radio-opaque calculi or other significant radiographic abnormality are seen. IMPRESSION: Large amount of stool throughout the entire colon and within the rectum. Electronically Signed   By: Greig Pique M.D.   On: 01/05/2024 17:59    Microbiology: Results for orders placed or performed during the hospital encounter of 05/10/23  Urine Culture     Status: Abnormal   Collection Time: 05/10/23 10:17 AM   Specimen: Urine, Random  Result Value Ref Range Status   Specimen Description   Final    URINE, RANDOM Performed at [facility], [address]  [place], [place] [address]    Special Requests   Final    NONE Reflexed from [id number] Performed at [facility], [address], [place], [place] [address]    Culture 10,000 COLONIES/mL ESCHERICHIA COLI (A)  Final   Report Status 05/12/2023 FINAL  Final   Organism ID, Bacteria ESCHERICHIA COLI (A)  Final      Susceptibility   Escherichia coli - MIC*    AMPICILLIN 4 SENSITIVE Sensitive     CEFAZOLIN <=4 SENSITIVE Sensitive     CEFEPIME <=0.12 SENSITIVE Sensitive     CEFTRIAXONE  <=0.25 SENSITIVE Sensitive     CIPROFLOXACIN  <=0.25 SENSITIVE Sensitive     GENTAMICIN <=1 SENSITIVE Sensitive     IMIPENEM <=0.25 SENSITIVE Sensitive     NITROFURANTOIN  <=16 SENSITIVE Sensitive     TRIMETH/SULFA <=20 SENSITIVE Sensitive     AMPICILLIN/SULBACTAM <=2 SENSITIVE Sensitive     PIP/TAZO <=4 SENSITIVE Sensitive ug/mL    * 10,000 COLONIES/mL ESCHERICHIA COLI    Labs: CBC: Recent Labs  Lab 01/05/24 1705  WBC 12.5*  HGB 14.5  HCT 44.4  MCV 98.0  PLT 303   Basic Metabolic Panel: Recent Labs  Lab 01/05/24 1705  NA 140  K 4.0  CL 106  CO2 23  GLUCOSE 144*  BUN 18  CREATININE 0.94  CALCIUM  10.0   Liver Function Tests: Recent Labs  Lab 01/05/24 1705  AST 22  ALT 20  ALKPHOS 54  BILITOT 0.7  PROT 7.6  ALBUMIN 3.8   CBG: No results for input(s): GLUCAP in the last 168 hours.  Discharge time spent: greater than 30 minutes.  Signed: AIDA CHO, MD [facility] 01/07/2024

## 2024-01-07 NOTE — Plan of Care (Signed)

## 2024-01-07 NOTE — Care Management Obs Status (Signed)
 MEDICARE OBSERVATION STATUS NOTIFICATION   Patient Details  Name: Steve Rhodes. MRN: [id number] Date of Birth: [birth year]   Medicare Observation Status Notification Given:  Yes    Yukari Flax W, CMA 01/07/2024, 10:34 AM

## 2024-01-17 ENCOUNTER — Other Ambulatory Visit: Payer: Self-pay

## 2024-01-17 DIAGNOSIS — N401 Enlarged prostate with lower urinary tract symptoms: Secondary | ICD-10-CM

## 2024-01-19 ENCOUNTER — Ambulatory Visit (INDEPENDENT_AMBULATORY_CARE_PROVIDER_SITE_OTHER): Admitting: Urology

## 2024-01-19 ENCOUNTER — Other Ambulatory Visit
Admission: RE | Admit: 2024-01-19 | Discharge: 2024-01-19 | Disposition: A | Discharge status: Home / Self Care | Attending: Urology | Admitting: Urology

## 2024-01-19 VITALS — BP 116/76 | HR 94 | Ht 72.0 in | Wt 205.0 lb

## 2024-01-19 DIAGNOSIS — K5909 Other constipation: Secondary | ICD-10-CM

## 2024-01-19 DIAGNOSIS — N390 Urinary tract infection, site not specified: Secondary | ICD-10-CM | POA: Diagnosis not present

## 2024-01-19 DIAGNOSIS — N401 Enlarged prostate with lower urinary tract symptoms: Secondary | ICD-10-CM

## 2024-01-19 LAB — URINALYSIS, COMPLETE (UACMP) WITH MICROSCOPIC
Glucose, UA: NEGATIVE mg/dL
Hgb urine dipstick: NEGATIVE
Leukocytes,Ua: NEGATIVE
Nitrite: NEGATIVE
Protein, ur: 30 mg/dL — AB
RBC / HPF: NONE SEEN RBC/hpf (ref 0–5)
Specific Gravity, Urine: >1.03 — ABNORMAL HIGH (ref 1.005–1.030)
pH: 5.5 (ref 5.0–8.0)

## 2024-01-19 LAB — BLADDER SCAN AMB NON-IMAGING: Scan Result: 0

## 2024-01-19 NOTE — Progress Notes (Signed)
 01/19/24 9:28 AM   Steve Rhodes. [birth year] [id number]  CC: BPH/urinary symptoms, recurrent UTI  HPI: 83 year old male referred for urinary symptoms and recent UTI in early July.  He actually was having fairly severe constipation at that time, and was recently hospitalized 01/06/2024 with severe constipation and abdominal pain with CT showing large stool ball in the rectum, and fecal impaction.  After his constipation resolved he denies any problems with urination.  It looks like he has been on oxybutynin as well, he is unsure why.  He denies any dysuria or gross hematuria at this time.  IPSS score today is 2 with quality-of-life mostly satisfied, urinalysis today benign, PVR is normal at 0ml.  CT from recent hospitalization showed no hydronephrosis, normal bladder, normal prostate.   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   GERD (gastroesophageal reflux disease)    Grover's disease    Hyperlipidemia    Hypertension    Mitral valve disorder     Surgical History: Past Surgical History:  Procedure Laterality Date   CARDIAC CATHETERIZATION Left 01/28/2016   Procedure: Left Heart Cath and Coronary Angiography;  Surgeon: Vinie DELENA Jude, MD;  Location: [facility] INVASIVE CV LAB;  Service: Cardiovascular;  Laterality: Left;   COLONOSCOPY WITH PROPOFOL  N/A 05/10/2015   Procedure: COLONOSCOPY WITH PROPOFOL ;  Surgeon: Deward CINDERELLA Piedmont, MD;  Location: [facility] ENDOSCOPY;  Service: Gastroenterology;  Laterality: N/A;   ESOPHAGOGASTRODUODENOSCOPY (EGD) WITH PROPOFOL  N/A 05/10/2015   Procedure: ESOPHAGOGASTRODUODENOSCOPY (EGD) WITH PROPOFOL ;  Surgeon: Deward CINDERELLA Piedmont, MD;  Location: [facility] ENDOSCOPY;  Service: Gastroenterology;  Laterality: N/A;   ESOPHAGOGASTRODUODENOSCOPY (EGD) WITH PROPOFOL  N/A 06/10/2015   Procedure: ESOPHAGOGASTRODUODENOSCOPY (EGD) WITH PROPOFOL ;  Surgeon: Deward CINDERELLA Piedmont, MD;  Location: [facility]  ENDOSCOPY;  Service: Gastroenterology;  Laterality: N/A;   ESOPHAGOGASTRODUODENOSCOPY (EGD) WITH PROPOFOL  N/A 06/29/2016   Procedure: ESOPHAGOGASTRODUODENOSCOPY (EGD) WITH PROPOFOL ;  Surgeon: Gladis RAYMOND Mariner, MD;  Location: [facility] ENDOSCOPY;  Service: Endoscopy;  Laterality: N/A;   SKIN CANCER EXCISION     on arms    Family History: Family History  Problem Relation Age of Onset   Colon cancer Neg Hx     Social History:  reports that he has never smoked. He has never used smokeless tobacco. He reports that he does not drink alcohol and does not use drugs.  Physical Exam: There were no vitals taken for this visit.   Constitutional:  Alert and oriented, No acute distress. Cardiovascular: No clubbing, cyanosis, or edema. Respiratory: Normal respiratory effort, no increased work of breathing. GI: Abdomen is soft, nontender, nondistended, no abdominal masses   Laboratory Data: Reviewed, see HPI  Pertinent Imaging: I have personally viewed and interpreted the CT scan dated 01/05/2024 showing severe constipation and fecal impaction, prostate measures 61 g, no hydronephrosis or stones.  Assessment & Plan:   83 year old male with severe constipation and fecal impaction likely contributing to urinary symptoms and UTI, all symptoms now resolved after recent hospitalization for fecal impaction.  He denies any urinary symptoms at all today, urinalysis benign, PVR normal at 0ml.  High suspicion that all of his urinary symptoms and UTI were related to fecal impaction.  Reassurance provided regarding normal CT scan from urologic perspective, normal urinalysis and PVR today.  Discontinue oxybutynin Recommend GI referral for constipation Follow-up with urology as needed   Redell Burnet, MD 01/19/2024  [facility]  Urology [address], [address] [place], [place] [address] [phone]

## 2024-01-19 NOTE — Patient Instructions (Addendum)
 Do not take oxybutynin anymore, this can contribute to constipation  I would recommend a gastroenterology referral for further evaluation of your bowel issues/constipation   Constipation, Adult Constipation is when a person has trouble pooping (having a bowel movement). When you have this condition, you may poop fewer than 3 times a week. Your poop (stool) may also be dry, hard, or bigger than normal. Follow these instructions at home: Eating and drinking  Eat foods that have a lot of fiber, such as: Fresh fruits and vegetables. Whole grains. Beans. Eat less of foods that are low in fiber and high in fat and sugar, such as: [place] fries. Hamburgers. Cookies. Candy. Soda. Drink enough fluid to keep your pee (urine) pale yellow. General instructions Exercise regularly or as told by your doctor. Try to do 150 minutes of exercise each week. Go to the restroom when you feel like you need to poop. Do not hold it in. Take over-the-counter and prescription medicines only as told by your doctor. These include any fiber supplements. When you poop: Do deep breathing while relaxing your lower belly (abdomen). Relax your pelvic floor. The pelvic floor is a group of muscles that support the rectum, bladder, and intestines (as well as the uterus in women). Watch your condition for any changes. Tell your doctor if you notice any. Keep all follow-up visits as told by your doctor. This is important. Contact a doctor if: You have pain that gets worse. You have a fever. You have not pooped for 4 days. You vomit. You are not hungry. You lose weight. You are bleeding from the opening of the butt (anus). You have thin, pencil-like poop. Get help right away if: You have a fever, and your symptoms suddenly get worse. You leak poop or have blood in your poop. Your belly feels hard or bigger than normal (bloated). You have very bad belly pain. You feel dizzy or you faint. Summary Constipation is  when a person poops fewer than 3 times a week, has trouble pooping, or has poop that is dry, hard, or bigger than normal. Eat foods that have a lot of fiber. Drink enough fluid to keep your pee (urine) pale yellow. Take over-the-counter and prescription medicines only as told by your doctor. These include any fiber supplements. This information is not intended to replace advice given to you by your health care provider. Make sure you discuss any questions you have with your health care provider. Document Revised: 04/22/2022 Document Reviewed: 04/22/2022 Elsevier Patient Education  2024 [employer].

## 2024-01-22 MED ORDER — METHOTREXATE SODIUM 2.5 MG TABLET
ORAL_TABLET | 0 refills | 0.00000 days
Start: 2024-01-22 — End: ?

## 2024-01-26 MED ORDER — METHOTREXATE SODIUM 2.5 MG TABLET
ORAL_TABLET | ORAL | 2 refills | 0.00000 days | Status: CP
Start: 2024-01-26 — End: ?

## 2024-01-31 ENCOUNTER — Ambulatory Visit
Admission: RE | Admit: 2024-01-31 | Discharge: 2024-01-31 | Disposition: A | Discharge status: Home / Self Care | Attending: Gastroenterology | Admitting: Gastroenterology

## 2024-01-31 ENCOUNTER — Encounter: Payer: Self-pay | Admitting: Gastroenterology

## 2024-01-31 ENCOUNTER — Ambulatory Visit

## 2024-01-31 ENCOUNTER — Encounter
Admission: RE | Disposition: A | Discharge status: Home / Self Care | Payer: Self-pay | Source: Home / Self Care | Attending: Gastroenterology

## 2024-01-31 ENCOUNTER — Other Ambulatory Visit: Payer: Self-pay

## 2024-01-31 DIAGNOSIS — K2950 Unspecified chronic gastritis without bleeding: Secondary | ICD-10-CM | POA: Diagnosis not present

## 2024-01-31 DIAGNOSIS — K222 Esophageal obstruction: Secondary | ICD-10-CM | POA: Diagnosis not present

## 2024-01-31 DIAGNOSIS — R933 Abnormal findings on diagnostic imaging of other parts of digestive tract: Secondary | ICD-10-CM | POA: Insufficient documentation

## 2024-01-31 DIAGNOSIS — K269 Duodenal ulcer, unspecified as acute or chronic, without hemorrhage or perforation: Secondary | ICD-10-CM | POA: Diagnosis not present

## 2024-01-31 DIAGNOSIS — K449 Diaphragmatic hernia without obstruction or gangrene: Secondary | ICD-10-CM | POA: Insufficient documentation

## 2024-01-31 DIAGNOSIS — Z8719 Personal history of other diseases of the digestive system: Secondary | ICD-10-CM | POA: Diagnosis not present

## 2024-01-31 DIAGNOSIS — K5900 Constipation, unspecified: Secondary | ICD-10-CM | POA: Diagnosis present

## 2024-01-31 DIAGNOSIS — K2100 Gastro-esophageal reflux disease with esophagitis, without bleeding: Secondary | ICD-10-CM | POA: Insufficient documentation

## 2024-01-31 DIAGNOSIS — I10 Essential (primary) hypertension: Secondary | ICD-10-CM | POA: Diagnosis not present

## 2024-01-31 HISTORY — PX: COLONOSCOPY: SHX5424

## 2024-01-31 HISTORY — PX: ESOPHAGOGASTRODUODENOSCOPY: SHX5428

## 2024-01-31 SURGERY — COLONOSCOPY
Anesthesia: General

## 2024-01-31 MED ORDER — EPHEDRINE SULFATE-NACL 50-0.9 MG/10ML-% IV SOSY
PREFILLED_SYRINGE | INTRAVENOUS | Status: DC | PRN
  Administered 2024-01-31 (×6): 10 mg via INTRAVENOUS

## 2024-01-31 MED ORDER — PHENYLEPHRINE 80 MCG/ML (10ML) SYRINGE FOR IV PUSH (FOR BLOOD PRESSURE SUPPORT)
PREFILLED_SYRINGE | INTRAVENOUS | Status: AC
  Filled 2024-01-31: qty 20

## 2024-01-31 MED ORDER — LIDOCAINE HCL (PF) 2 % IJ SOLN
INTRAMUSCULAR | Status: DC | PRN
  Administered 2024-01-31 (×2): 100 mg via INTRADERMAL

## 2024-01-31 MED ORDER — SODIUM CHLORIDE 0.9 % IV SOLN: INTRAVENOUS | Status: DC

## 2024-01-31 MED ORDER — PHENYLEPHRINE 80 MCG/ML (10ML) SYRINGE FOR IV PUSH (FOR BLOOD PRESSURE SUPPORT)
PREFILLED_SYRINGE | INTRAVENOUS | Status: DC | PRN
  Administered 2024-01-31 (×2): 80 ug via INTRAVENOUS
  Administered 2024-01-31 (×2): 160 ug via INTRAVENOUS

## 2024-01-31 MED ORDER — PROPOFOL 500 MG/50ML IV EMUL
INTRAVENOUS | Status: DC | PRN
  Administered 2024-01-31: 100 mg via INTRAVENOUS
  Administered 2024-01-31 (×2): 150 ug/kg/min via INTRAVENOUS
  Administered 2024-01-31: 100 mg via INTRAVENOUS

## 2024-01-31 NOTE — Anesthesia Preprocedure Evaluation (Signed)
 Anesthesia Evaluation  Patient identified by MRN, date of birth, ID band Patient awake    Reviewed: Allergy & Precautions, H&P , NPO status , Patient's Chart, lab work & pertinent test results, reviewed documented beta blocker date and time   Airway Mallampati: II   Neck ROM: full    Dental  (+) Poor Dentition   Pulmonary neg pulmonary ROS   Pulmonary exam normal        Cardiovascular Exercise Tolerance: Poor hypertension, On Medications negative cardio ROS Normal cardiovascular exam Rhythm:regular Rate:Normal     Neuro/Psych negative neurological ROS  negative psych ROS   GI/Hepatic Neg liver ROS,GERD  Medicated,,  Endo/Other  negative endocrine ROS    Renal/GU negative Renal ROS  negative genitourinary   Musculoskeletal   Abdominal   Peds  Hematology negative hematology ROS (+)   Anesthesia Other Findings Past Medical History: 05/2015: Barrett esophagus No date: Cancer [facility])     Comment:  skin cancer 04/2015: Colon polyp     Comment:  TUBULAR ADENOMA No date: GERD (gastroesophageal reflux disease) No date: Grover's disease No date: Hyperlipidemia No date: Hypertension No date: Mitral valve disorder Past Surgical History: 01/28/2016: CARDIAC CATHETERIZATION; Left     Comment:  Procedure: Left Heart Cath and Coronary Angiography;                Surgeon: Vinie DELENA Jude, MD;  Location: [facility] INVASIVE CV               LAB;  Service: Cardiovascular;  Laterality: Left; 05/10/2015: COLONOSCOPY WITH PROPOFOL ; N/A     Comment:  Procedure: COLONOSCOPY WITH PROPOFOL ;  Surgeon: Deward CINDERELLA Piedmont, MD;  Location: [facility] ENDOSCOPY;  Service:               Gastroenterology;  Laterality: N/A; 05/10/2015: ESOPHAGOGASTRODUODENOSCOPY (EGD) WITH PROPOFOL ; N/A     Comment:  Procedure: ESOPHAGOGASTRODUODENOSCOPY (EGD) WITH               PROPOFOL ;  Surgeon: Deward CINDERELLA Piedmont, MD;  Location: [facility]               ENDOSCOPY;  Service:  Gastroenterology;  Laterality: N/A; 06/10/2015: ESOPHAGOGASTRODUODENOSCOPY (EGD) WITH PROPOFOL ; N/A     Comment:  Procedure: ESOPHAGOGASTRODUODENOSCOPY (EGD) WITH               PROPOFOL ;  Surgeon: Deward CINDERELLA Piedmont, MD;  Location: [facility]               ENDOSCOPY;  Service: Gastroenterology;  Laterality: N/A; 06/29/2016: ESOPHAGOGASTRODUODENOSCOPY (EGD) WITH PROPOFOL ; N/A     Comment:  Procedure: ESOPHAGOGASTRODUODENOSCOPY (EGD) WITH               PROPOFOL ;  Surgeon: Gladis RAYMOND Mariner, MD;  Location:               [facility] ENDOSCOPY;  Service: Endoscopy;  Laterality: N/A; No date: SKIN CANCER EXCISION     Comment:  on arms BMI    Body Mass Index: 27.80 kg/m     Reproductive/Obstetrics negative OB ROS                              Anesthesia Physical Anesthesia Plan  ASA: 3  Anesthesia Plan: General   Post-op Pain Management:    Induction:   PONV Risk Score and Plan:   Airway Management Planned:  Additional Equipment:   Intra-op Plan:   Post-operative Plan:   Informed Consent: I have reviewed the patients History and Physical, chart, labs and discussed the procedure including the risks, benefits and alternatives for the proposed anesthesia with the patient or authorized representative who has indicated his/her understanding and acceptance.     Dental Advisory Given  Plan Discussed with: CRNA  Anesthesia Plan Comments:         Anesthesia Quick Evaluation

## 2024-01-31 NOTE — H&P (Signed)
 Pre-Procedure H&P   Patient ID: Steve Rhodes. is a 83 y.o. male.  Gastroenterology Provider: Elspeth Ozell Jungling, DO  Referring Provider: Wanda Gails, NP PCP: [name], MD  Date: 01/31/2024  HPI Steve Rhodes. is a 83 y.o. male who presents today for Esophagogastroduodenoscopy and Colonoscopy for Barrett's esophagus, abnormal CT imaging, change in bowel habits .  Patient with a history of short segment Barrett's esophagus as of EGD December 2016.  Repeat EGD in 2018 was negative for Barrett's esophagus and H. pylori, although gastritis was present.  The hiatal hernia was also appreciated.  Candida was also present on both exams.  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  CT December 2024 was negative for such findings.   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   Cancer [facility])    skin cancer   Colon polyp 04/2015   TUBULAR ADENOMA   GERD (gastroesophageal reflux disease)    Grover's disease    Hyperlipidemia    Hypertension    Mitral valve disorder     Past Surgical History:  Procedure Laterality Date   CARDIAC CATHETERIZATION Left 01/28/2016   Procedure: Left Heart Cath and Coronary Angiography;  Surgeon: Vinie DELENA Jude, MD;  Location: [facility] INVASIVE CV LAB;  Service: Cardiovascular;  Laterality: Left;   COLONOSCOPY WITH PROPOFOL  N/A 05/10/2015   Procedure: COLONOSCOPY WITH PROPOFOL ;  Surgeon: Deward CINDERELLA Piedmont, MD;  Location: [facility] ENDOSCOPY;  Service: Gastroenterology;  Laterality: N/A;   ESOPHAGOGASTRODUODENOSCOPY (EGD) WITH PROPOFOL  N/A 05/10/2015   Procedure: ESOPHAGOGASTRODUODENOSCOPY (EGD) WITH PROPOFOL ;  Surgeon: Deward CINDERELLA Piedmont, MD;  Location: [facility] ENDOSCOPY;  Service: Gastroenterology;  Laterality: N/A;   ESOPHAGOGASTRODUODENOSCOPY (EGD) WITH PROPOFOL  N/A 06/10/2015    Procedure: ESOPHAGOGASTRODUODENOSCOPY (EGD) WITH PROPOFOL ;  Surgeon: Deward CINDERELLA Piedmont, MD;  Location: [facility] ENDOSCOPY;  Service: Gastroenterology;  Laterality: N/A;   ESOPHAGOGASTRODUODENOSCOPY (EGD) WITH PROPOFOL  N/A 06/29/2016   Procedure: ESOPHAGOGASTRODUODENOSCOPY (EGD) WITH PROPOFOL ;  Surgeon: Gladis RAYMOND Mariner, MD;  Location: [facility] ENDOSCOPY;  Service: Endoscopy;  Laterality: N/A;   SKIN CANCER EXCISION     on arms    Family History No h/o GI disease or malignancy  Review of Systems  Constitutional:  Negative for activity change, appetite change, chills, diaphoresis, fatigue, fever and unexpected weight change.  HENT:  Negative for trouble swallowing and voice change.   Respiratory:  Negative for shortness of breath and wheezing.   Cardiovascular:  Negative for chest pain, palpitations and leg swelling.  Gastrointestinal:  Positive for constipation. Negative for abdominal distention, abdominal pain, anal bleeding, blood in stool, diarrhea, nausea and vomiting.  Musculoskeletal:  Negative for arthralgias and myalgias.  Skin:  Negative for color change and pallor.  Neurological:  Negative for dizziness, syncope and weakness.  Psychiatric/Behavioral:  Negative for confusion. The patient is not nervous/anxious.   All other systems reviewed and are negative.    Medications No current facility-administered medications on file prior to encounter.   Current Outpatient Medications on File Prior to Encounter  Medication Sig Dispense Refill   atorvastatin  (LIPITOR) 10 MG tablet Take 10 mg by mouth daily.     doxepin (SINEQUAN) 10 MG capsule Take 10 mg by mouth.     fesoterodine (TOVIAZ) 4 MG TB24 tablet Take by mouth.     folic acid (FOLVITE)  1 MG tablet TAKE 3 TABLETS BY MOUTH ONCE DAILY ON  YOUR  NON  METHOTREXATE  DAYS     lisinopril -hydrochlorothiazide  (PRINZIDE ,ZESTORETIC ) 20-25 MG tablet Take 1 tablet by mouth daily.     omeprazole (PRILOSEC) 40 MG capsule Take 40 mg by mouth daily.      propranolol  (INDERAL ) 20 MG tablet Take 20 mg by mouth 2 (two) times daily.     glycerin  adult 2 g suppository Place 1 suppository rectally as needed for constipation. 12 suppository 0   polyethylene glycol (MIRALAX ) 17 g packet Take 17 g by mouth daily. 14 each 0   zolpidem (AMBIEN) 10 MG tablet Take 10 mg by mouth at bedtime as needed for sleep.      Pertinent medications related to GI and procedure were reviewed by me with the patient prior to the procedure   Current Facility-Administered Medications:    0.9 %  sodium chloride  infusion, , Intravenous, Continuous, Onita Elspeth Sharper, DO, Last Rate: 20 mL/hr at 01/31/24 1239, New Bag at 01/31/24 1239  sodium chloride  20 mL/hr at 01/31/24 1239       Allergies  Allergen Reactions   Latex Hives and Rash   Sulfa Antibiotics Other (See Comments)    Reaction in childhood   Allergies were reviewed by me prior to the procedure  Objective   Body mass index is 27.8 kg/m. Vitals:   01/31/24 1234  BP: 130/79  Pulse: 87  Temp: 97.6 F (36.4 C)  TempSrc: Temporal  SpO2: 100%  Weight: 93 kg    Physical Exam Vitals and nursing note reviewed.  Constitutional:      General: He is not in acute distress.    Appearance: Normal appearance. He is not ill-appearing, toxic-appearing or diaphoretic.  HENT:     Head: Normocephalic and atraumatic.     Nose: Nose normal.     Mouth/Throat:     Mouth: Mucous membranes are moist.     Pharynx: Oropharynx is clear.  Eyes:     General: No scleral icterus.    Extraocular Movements: Extraocular movements intact.  Cardiovascular:     Rate and Rhythm: Normal rate and regular rhythm.     Heart sounds: Normal heart sounds. No murmur heard.    No friction rub. No gallop.  Pulmonary:     Effort: Pulmonary effort is normal. No respiratory distress.     Breath sounds: Normal breath sounds. No wheezing, rhonchi or rales.  Abdominal:     General: Bowel sounds are normal. There is no distension.      Palpations: Abdomen is soft.     Tenderness: There is no abdominal tenderness. There is no guarding or rebound.  Musculoskeletal:     Cervical back: Neck supple.     Right lower leg: No edema.     Left lower leg: No edema.  Skin:    General: Skin is warm and dry.     Coloration: Skin is not jaundiced or pale.  Neurological:     General: No focal deficit present.     Mental Status: He is alert and oriented to person, place, and time. Mental status is at baseline.  Psychiatric:        Mood and Affect: Mood normal.        Behavior: Behavior normal.        Thought Content: Thought content normal.        Judgment: Judgment normal.      Assessment:  Steve Rhodes.  is a 83 y.o. male  who presents today for Esophagogastroduodenoscopy and Colonoscopy for Barrett's esophagus, abnormal CT imaging, change in bowel habits .  Plan:  Esophagogastroduodenoscopy and Colonoscopy with possible intervention today  Esophagogastroduodenoscopy and Colonoscopy with possible biopsy, control of bleeding, polypectomy, and interventions as necessary has been discussed with the patient/patient representative. Informed consent was obtained from the patient/patient representative after explaining the indication, nature, and risks of the procedure including but not limited to death, bleeding, perforation, missed neoplasm/lesions, cardiorespiratory compromise, and reaction to medications. Opportunity for questions was given and appropriate answers were provided. Patient/patient representative has verbalized understanding is amenable to undergoing the procedure.   Elspeth Ozell Jungling, DO  [facility] Gastroenterology  Portions of the record may have been created with voice recognition software. Occasional wrong-word or 'sound-a-like' substitutions may have occurred due to the inherent limitations of voice recognition software.  Read the chart carefully and recognize, using context, where substitutions may  have occurred.

## 2024-01-31 NOTE — Op Note (Signed)
 [facility] Gastroenterology Patient Name: Steve Rhodes Procedure Date: 01/31/2024 1:41 PM MRN: [id number] Account #: [id number] Date of Birth: Sep 10, 1940 Admit Type: Outpatient Age: 83 Room: [facility] ENDO ROOM 2 Gender: Male Note Status: Finalized Instrument Name: Upper GI Scope [id number] Procedure:             Upper GI endoscopy Indications:           Follow-up of Barrett's esophagus Providers:             Elspeth Ozell Onita ROSALEA, DO Referring MD:          Leita LOIS Alder (Referring MD) Medicines:             Monitored Anesthesia Care Complications:         No immediate complications. Estimated blood loss:                         Minimal. Procedure:             Pre-Anesthesia Assessment:                        - Prior to the procedure, a History and Physical was                         performed, and patient medications and allergies were                         reviewed. The patient is competent. The risks and                         benefits of the procedure and the sedation options and                         risks were discussed with the patient. All questions                         were answered and informed consent was obtained.                         Patient identification and proposed procedure were                         verified by the physician, the nurse, the anesthetist                         and the technician in the endoscopy suite. Mental                         Status Examination: alert and oriented. Airway                         Examination: normal oropharyngeal airway and neck                         mobility. Respiratory Examination: clear to                         auscultation. CV Examination: RRR, no murmurs, no S3  or S4. Prophylactic Antibiotics: The patient does not                         require prophylactic antibiotics. Prior                         Anticoagulants: The patient has taken no anticoagulant                          or antiplatelet agents. ASA Grade Assessment: III - A                         patient with severe systemic disease. After reviewing                         the risks and benefits, the patient was deemed in                         satisfactory condition to undergo the procedure. The                         anesthesia plan was to use monitored anesthesia care                         (MAC). Immediately prior to administration of                         medications, the patient was re-assessed for adequacy                         to receive sedatives. The heart rate, respiratory                         rate, oxygen saturations, blood pressure, adequacy of                         pulmonary ventilation, and response to care were                         monitored throughout the procedure. The physical                         status of the patient was re-assessed after the                         procedure.                        After obtaining informed consent, the endoscope was                         passed under direct vision. Throughout the procedure,                         the patient's blood pressure, pulse, and oxygen                         saturations were monitored continuously. The Endoscope  was introduced through the mouth, and advanced to the                         second part of duodenum. The upper GI endoscopy was                         accomplished without difficulty. The patient tolerated                         the procedure well. Findings:      A single localized erosion without bleeding was found in the duodenal       bulb. Biopsies were taken with a cold forceps for histology. Estimated       blood loss was minimal.      The exam of the duodenum was otherwise normal.      A small hiatal hernia was present. Estimated blood loss: none.      Normal mucosa was found in the entire examined stomach. Biopsies were       taken with a cold  forceps for Helicobacter pylori testing. Estimated       blood loss was minimal.      The exam of the stomach was otherwise normal.      Esophagogastric landmarks were identified: the gastroesophageal junction       was found at 40 cm from the incisors.      The Z-line was regular. Biopsies were taken with a cold forceps for       histology. Estimated blood loss was minimal.      A widely patent Schatzki ring was found at the gastroesophageal       junction. Estimated blood loss: none.      LA Grade A (one or more mucosal breaks less than 5 mm, not extending       between tops of 2 mucosal folds) esophagitis with no bleeding was found.       Estimated blood loss: none.      The exam of the esophagus was otherwise normal. Impression:            - Duodenal erosion without bleeding. Biopsied.                        - Small hiatal hernia.                        - Normal mucosa was found in the entire stomach.                         Biopsied.                        - Esophagogastric landmarks identified.                        - Z-line regular. Biopsied.                        - Widely patent Schatzki ring.                        - LA Grade A reflux esophagitis with no bleeding. Recommendation:        - Patient has a contact number available for  emergencies. The signs and symptoms of potential                         delayed complications were discussed with the patient.                         Return to normal activities tomorrow. Written                         discharge instructions were provided to the patient.                        - Discharge patient to home.                        - Resume previous diet.                        - Continue present medications.                        - Await pathology results.                        - Repeat upper endoscopy for surveillance based on                         pathology results.                        - Return to  GI office as previously scheduled.                        - The findings and recommendations were discussed with                         the patient. Procedure Code(s):     --- Professional ---                        [phone], Esophagogastroduodenoscopy, flexible,                         transoral; with biopsy, single or multiple Diagnosis Code(s):     --- Professional ---                        K26.9, Duodenal ulcer, unspecified as acute or                         chronic, without hemorrhage or perforation                        K44.9, Diaphragmatic hernia without obstruction or                         gangrene                        K22.70, Barrett's esophagus without dysplasia                        K22.2, Esophageal obstruction  K21.00, Gastro-esophageal reflux disease with                         esophagitis, without bleeding CPT copyright 2022 American Medical Association. All rights reserved. The codes documented in this report are preliminary and upon coder review may  be revised to meet current compliance requirements. Attending Participation:      I personally performed the entire procedure. Elspeth Jungling, DO Elspeth Ozell Jungling DO, DO 01/31/2024 1:59:00 PM This report has been signed electronically. Number of Addenda: 0 Note Initiated On: 01/31/2024 1:41 PM Estimated Blood Loss:  Estimated blood loss was minimal.      [facility]

## 2024-01-31 NOTE — Interval H&P Note (Signed)
 History and Physical Interval Note: Preprocedure H&P from 01/31/24  was reviewed and there was no interval change after seeing and examining the patient.  Written consent was obtained from the patient after discussion of risks, benefits, and alternatives. Patient has consented to proceed with Esophagogastroduodenoscopy and Colonoscopy with possible intervention   01/31/2024 1:03 PM  Steve Rhodes.  has presented today for surgery, with the diagnosis of Barretts esophagus, abnormal CT scanof GI tract.  The various methods of treatment have been discussed with the patient and family. After consideration of risks, benefits and other options for treatment, the patient has consented to  Procedure(s): COLONOSCOPY (N/A) EGD (ESOPHAGOGASTRODUODENOSCOPY) (N/A) as a surgical intervention.  The patient's history has been reviewed, patient examined, no change in status, stable for surgery.  I have reviewed the patient's chart and labs.  Questions were answered to the patient's satisfaction.     Elspeth Ozell Jungling

## 2024-01-31 NOTE — Op Note (Signed)
 [facility] Gastroenterology Patient Name: Steve Rhodes Procedure Date: 01/31/2024 1:40 PM MRN: [id number] Account #: [id number] Date of Birth: Apr 05, 1941 Admit Type: Outpatient Age: 83 Room: [facility] ENDO ROOM 2 Gender: Male Note Status: Finalized Instrument Name: Peds Colonoscope [id number] Procedure:             Flexible Sigmoidoscopy Indications:           Abnormal CT of the GI tract, Constipation Providers:             Elspeth Ozell Onita ROSALEA, DO Referring MD:          Leita LOIS Alder (Referring MD) Medicines:             Monitored Anesthesia Care Complications:         No immediate complications. Estimated blood loss: None. Procedure:             Pre-Anesthesia Assessment:                        - Prior to the procedure, a History and Physical was                         performed, and patient medications and allergies were                         reviewed. The patient is competent. The risks and                         benefits of the procedure and the sedation options and                         risks were discussed with the patient. All questions                         were answered and informed consent was obtained.                         Patient identification and proposed procedure were                         verified by the physician, the nurse, the anesthetist                         and the technician in the endoscopy suite. Mental                         Status Examination: alert and oriented. Airway                         Examination: normal oropharyngeal airway and neck                         mobility. Respiratory Examination: clear to                         auscultation. CV Examination: RRR, no murmurs, no S3                         or S4. Prophylactic Antibiotics: The patient does not  require prophylactic antibiotics. Prior                         Anticoagulants: The patient has taken no anticoagulant                          or antiplatelet agents. ASA Grade Assessment: III - A                         patient with severe systemic disease. After reviewing                         the risks and benefits, the patient was deemed in                         satisfactory condition to undergo the procedure. The                         anesthesia plan was to use monitored anesthesia care                         (MAC). Immediately prior to administration of                         medications, the patient was re-assessed for adequacy                         to receive sedatives. The heart rate, respiratory                         rate, oxygen saturations, blood pressure, adequacy of                         pulmonary ventilation, and response to care were                         monitored throughout the procedure. The physical                         status of the patient was re-assessed after the                         procedure.                        After obtaining informed consent, the scope was passed                         under direct vision. The Colonoscope was introduced                         through the anus and advanced to the the descending                         colon. After obtaining informed consent, the scope was                         passed under direct vision.The flexible sigmoidoscopy  was accomplished without difficulty. The patient                         tolerated the procedure well. The quality of the bowel                         preparation was poor. Findings:      Stool palpated on rectal exam. Mobile, Dismpacted as much stool as       possible. Estimated blood loss: none.      A large amount of semi-liquid solid stool was found in the rectum, in       the sigmoid colon and in the descending colon, precluding visualization.       Estimated blood loss: none. Impression:            - Preparation of the colon was poor.                        - Stool in the rectum, in  the sigmoid colon and in the                         descending colon.                        - No specimens collected. Recommendation:        - Discharge patient to home.                        - Patient has a contact number available for                         emergencies. The signs and symptoms of potential                         delayed complications were discussed with the patient.                         Return to normal activities tomorrow. Written                         discharge instructions were provided to the patient.                        - Resume previous diet.                        - Recommend miralax  cleanoutf followed by one capful                         of miralax  twice a day.                        Will send instructions on [facility] Mychart                        - Return to GI clinic as previously scheduled.                        - The findings and recommendations were discussed with  the patient's family.                        - The findings and recommendations were discussed with                         the patient. Procedure Code(s):     --- Professional ---                        [phone], Sigmoidoscopy, flexible; diagnostic, including                         collection of specimen(s) by brushing or washing, when                         performed (separate procedure) Diagnosis Code(s):     --- Professional ---                        K59.00, Constipation, unspecified                        R93.3, Abnormal findings on diagnostic imaging of                         other parts of digestive tract CPT copyright 2022 American Medical Association. All rights reserved. The codes documented in this report are preliminary and upon coder review may  be revised to meet current compliance requirements. Attending Participation:      I personally performed the entire procedure. Elspeth Jungling, DO Elspeth Ozell Jungling DO, DO 01/31/2024 2:27:05 PM This  report has been signed electronically. Number of Addenda: 0 Note Initiated On: 01/31/2024 1:40 PM Estimated Blood Loss:  Estimated blood loss: none.      [facility]

## 2024-01-31 NOTE — Transfer of Care (Signed)
 Immediate Anesthesia Transfer of Care Note  Patient: Steve Rhodes.  Procedure(s) Performed: COLONOSCOPY EGD (ESOPHAGOGASTRODUODENOSCOPY)  Patient Location: Endoscopy Unit  Anesthesia Type:General  Level of Consciousness: drowsy  Airway & Oxygen Therapy: Patient Spontanous Breathing  Post-op Assessment: Report given to RN and Post -op Vital signs reviewed and stable  Post vital signs: Reviewed and stable  Last Vitals:  Vitals Value Taken Time  BP 120/65 01/31/24 14:21  Temp 35.9 C 01/31/24 14:19  Pulse 88 01/31/24 14:22  Resp 29 01/31/24 14:22  SpO2 96 % 01/31/24 14:22  Vitals shown include unfiled device data.  Last Pain:  Vitals:   01/31/24 1419  TempSrc: Temporal  PainSc: Asleep         Complications: There were no known notable events for this encounter.

## 2024-02-01 ENCOUNTER — Encounter: Payer: Self-pay | Admitting: Gastroenterology

## 2024-02-01 LAB — SURGICAL PATHOLOGY

## 2024-02-02 DIAGNOSIS — B001 Herpesviral vesicular dermatitis: Principal | ICD-10-CM

## 2024-02-02 MED ORDER — VALACYCLOVIR 500 MG TABLET
ORAL_TABLET | 0 refills | 0.00000 days
Start: 2024-02-02 — End: ?

## 2024-02-02 NOTE — Anesthesia Postprocedure Evaluation (Signed)
 Anesthesia Post Note  Patient: Steve Rhodes.  Procedure(s) Performed: COLONOSCOPY EGD (ESOPHAGOGASTRODUODENOSCOPY)  Patient location during evaluation: PACU Anesthesia Type: General Level of consciousness: awake and alert Pain management: pain level controlled Vital Signs Assessment: post-procedure vital signs reviewed and stable Respiratory status: spontaneous breathing, nonlabored ventilation, respiratory function stable and patient connected to nasal cannula oxygen Cardiovascular status: blood pressure returned to baseline and stable Postop Assessment: no apparent nausea or vomiting Anesthetic complications: no   There were no known notable events for this encounter.   Last Vitals:  Vitals:   01/31/24 1430 01/31/24 1440  BP: 106/64 117/69  Pulse: 90 87  Resp: 19 18  Temp:    SpO2: 96% 96%    Last Pain:  Vitals:   02/01/24 0724  TempSrc:   PainSc: 0-No pain                 Steve Rhodes

## 2024-02-06 MED ORDER — VALACYCLOVIR 500 MG TABLET
ORAL_TABLET | ORAL | 0 refills | 0.00000 days | Status: CP
Start: 2024-02-06 — End: ?

## 2024-02-08 ENCOUNTER — Inpatient Hospital Stay: Admission: EM | Admit: 2024-02-08 | Discharge: 2024-02-12 | DRG: 163 | Disposition: A | Discharge status: Home Health

## 2024-02-08 ENCOUNTER — Other Ambulatory Visit: Payer: Self-pay

## 2024-02-08 ENCOUNTER — Emergency Department

## 2024-02-08 DIAGNOSIS — J9601 Acute respiratory failure with hypoxia: Secondary | ICD-10-CM | POA: Diagnosis present

## 2024-02-08 DIAGNOSIS — Z882 Allergy status to sulfonamides status: Secondary | ICD-10-CM

## 2024-02-08 DIAGNOSIS — J449 Chronic obstructive pulmonary disease, unspecified: Secondary | ICD-10-CM | POA: Diagnosis present

## 2024-02-08 DIAGNOSIS — I2489 Other forms of acute ischemic heart disease: Secondary | ICD-10-CM | POA: Diagnosis present

## 2024-02-08 DIAGNOSIS — Z1152 Encounter for screening for COVID-19: Secondary | ICD-10-CM

## 2024-02-08 DIAGNOSIS — E876 Hypokalemia: Secondary | ICD-10-CM | POA: Diagnosis present

## 2024-02-08 DIAGNOSIS — Z79899 Other long term (current) drug therapy: Secondary | ICD-10-CM

## 2024-02-08 DIAGNOSIS — E8770 Fluid overload, unspecified: Secondary | ICD-10-CM | POA: Diagnosis present

## 2024-02-08 DIAGNOSIS — Z7901 Long term (current) use of anticoagulants: Secondary | ICD-10-CM

## 2024-02-08 DIAGNOSIS — F419 Anxiety disorder, unspecified: Secondary | ICD-10-CM | POA: Diagnosis present

## 2024-02-08 DIAGNOSIS — E785 Hyperlipidemia, unspecified: Secondary | ICD-10-CM | POA: Diagnosis present

## 2024-02-08 DIAGNOSIS — I2699 Other pulmonary embolism without acute cor pulmonale: Principal | ICD-10-CM

## 2024-02-08 DIAGNOSIS — I2781 Cor pulmonale (chronic): Secondary | ICD-10-CM

## 2024-02-08 DIAGNOSIS — Z85828 Personal history of other malignant neoplasm of skin: Secondary | ICD-10-CM

## 2024-02-08 DIAGNOSIS — R0603 Acute respiratory distress: Secondary | ICD-10-CM | POA: Diagnosis not present

## 2024-02-08 DIAGNOSIS — G4733 Obstructive sleep apnea (adult) (pediatric): Secondary | ICD-10-CM

## 2024-02-08 DIAGNOSIS — Z860100 Personal history of colon polyps, unspecified: Secondary | ICD-10-CM

## 2024-02-08 DIAGNOSIS — D72829 Elevated white blood cell count, unspecified: Secondary | ICD-10-CM | POA: Diagnosis present

## 2024-02-08 DIAGNOSIS — Z91040 Latex allergy status: Secondary | ICD-10-CM

## 2024-02-08 DIAGNOSIS — G4700 Insomnia, unspecified: Secondary | ICD-10-CM | POA: Diagnosis present

## 2024-02-08 DIAGNOSIS — I10 Essential (primary) hypertension: Secondary | ICD-10-CM | POA: Diagnosis present

## 2024-02-08 DIAGNOSIS — I2609 Other pulmonary embolism with acute cor pulmonale: Secondary | ICD-10-CM | POA: Diagnosis not present

## 2024-02-08 MED ORDER — IPRATROPIUM-ALBUTEROL 0.5-2.5 (3) MG/3ML IN SOLN
3.0000 mL | Freq: Once | RESPIRATORY_TRACT | Status: AC
  Administered 2024-02-08: 3 mL via RESPIRATORY_TRACT
  Filled 2024-02-08: qty 3

## 2024-02-08 MED ORDER — METHYLPREDNISOLONE SODIUM SUCC 125 MG IJ SOLR
125.0000 mg | Freq: Once | INTRAMUSCULAR | Status: AC
  Administered 2024-02-08: 125 mg via INTRAVENOUS
  Filled 2024-02-08: qty 2

## 2024-02-08 NOTE — ED Provider Notes (Signed)
 [facility] Provider Note    Event Date/Time   First MD Initiated Contact with Patient 02/08/24 2329     (approximate)   History   Respiratory Distress   HPI  Steve Rhodes. is a 83 y.o. male with history of COPD not on oxygen, hypertension, hyperlipidemia who presents to the emergency department shortness of breath.  Was found to have oxygen saturation of 80% on room air.  Denies calf pain or calf tenderness.  No history of PE or DVT.  No fever or cough.  Does report that he was recently admitted to the hospital about a month ago for constipation.   History provided by patient, family, EMS.    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   GERD (gastroesophageal reflux disease)    Grover's disease    Hyperlipidemia    Hypertension    Mitral valve disorder     Past Surgical History:  Procedure Laterality Date   CARDIAC CATHETERIZATION Left 01/28/2016   Procedure: Left Heart Cath and Coronary Angiography;  Surgeon: Vinie DELENA Jude, MD;  Location: [facility] INVASIVE CV LAB;  Service: Cardiovascular;  Laterality: Left;   COLONOSCOPY N/A 01/31/2024   Procedure: COLONOSCOPY;  Surgeon: Onita Elspeth Sharper, DO;  Location: [facility] ENDOSCOPY;  Service: Gastroenterology;  Laterality: N/A;   COLONOSCOPY WITH PROPOFOL  N/A 05/10/2015   Procedure: COLONOSCOPY WITH PROPOFOL ;  Surgeon: Deward CINDERELLA Piedmont, MD;  Location: [facility] ENDOSCOPY;  Service: Gastroenterology;  Laterality: N/A;   ESOPHAGOGASTRODUODENOSCOPY N/A 01/31/2024   Procedure: EGD (ESOPHAGOGASTRODUODENOSCOPY);  Surgeon: Onita Elspeth Sharper, DO;  Location: [facility] ENDOSCOPY;  Service: Gastroenterology;  Laterality: N/A;   ESOPHAGOGASTRODUODENOSCOPY (EGD) WITH PROPOFOL  N/A 05/10/2015   Procedure: ESOPHAGOGASTRODUODENOSCOPY (EGD) WITH PROPOFOL ;  Surgeon: Deward CINDERELLA Piedmont, MD;  Location: [facility] ENDOSCOPY;  Service: Gastroenterology;   Laterality: N/A;   ESOPHAGOGASTRODUODENOSCOPY (EGD) WITH PROPOFOL  N/A 06/10/2015   Procedure: ESOPHAGOGASTRODUODENOSCOPY (EGD) WITH PROPOFOL ;  Surgeon: Deward CINDERELLA Piedmont, MD;  Location: [facility] ENDOSCOPY;  Service: Gastroenterology;  Laterality: N/A;   ESOPHAGOGASTRODUODENOSCOPY (EGD) WITH PROPOFOL  N/A 06/29/2016   Procedure: ESOPHAGOGASTRODUODENOSCOPY (EGD) WITH PROPOFOL ;  Surgeon: Gladis RAYMOND Mariner, MD;  Location: [facility] ENDOSCOPY;  Service: Endoscopy;  Laterality: N/A;   SKIN CANCER EXCISION     on arms    MEDICATIONS:  Prior to Admission medications   Medication Sig Start Date End Date Taking? Authorizing Provider  atorvastatin  (LIPITOR) 10 MG tablet Take 10 mg by mouth daily.    [provider]  doxepin (SINEQUAN) 10 MG capsule Take 10 mg by mouth.    [provider]  fesoterodine (TOVIAZ) 4 MG TB24 tablet Take by mouth. 08/30/23   [provider]  folic acid (FOLVITE) 1 MG tablet TAKE 3 TABLETS BY MOUTH ONCE DAILY ON  YOUR  NON  METHOTREXATE  DAYS 11/07/19   [provider]  glycerin  adult 2 g suppository Place 1 suppository rectally as needed for constipation. 01/05/24   Clarine Sharper DELENA, MD  lisinopril -hydrochlorothiazide  (PRINZIDE ,ZESTORETIC ) 20-25 MG tablet Take 1 tablet by mouth daily.    [provider]  omeprazole (PRILOSEC) 40 MG capsule Take 40 mg by mouth daily. 10/19/23   [provider]  polyethylene glycol (MIRALAX ) 17 g packet Take 17 g by mouth daily. 01/05/24   Clarine Sharper DELENA, MD  propranolol  (INDERAL ) 20 MG tablet Take 20 mg by mouth 2 (two) times daily. 11/18/23  [provider]  zolpidem (AMBIEN) 10 MG tablet Take 10 mg by mouth at bedtime as needed for sleep.    [provider]    Physical Exam   Triage Vital Signs: ED Triage Vitals  Encounter Vitals Group     BP 02/08/24 2341 (!) 116/100     Girls Systolic BP Percentile --      Girls Diastolic BP Percentile --      Boys Systolic BP Percentile --      Boys  Diastolic BP Percentile --      Pulse Rate 02/08/24 2335 (!) 115     Resp 02/08/24 2344 (!) 26     Temp 02/08/24 2335 98.1 F (36.7 C)     Temp Source 02/08/24 2335 Axillary     SpO2 02/08/24 2335 100 %     Weight 02/08/24 2338 200 lb (90.7 kg)     Height 02/08/24 2338 6' 1 (1.854 m)     Head Circumference --      Peak Flow --      Pain Score 02/08/24 2337 4     Pain Loc --      Pain Education --      Exclude from Growth Chart --     Most recent vital signs: Vitals:   02/09/24 0830 02/09/24 0845  BP: 97/69 99/68  Pulse: 87 89  Resp: (!) 21 (!) 22  Temp: 97.6 F (36.4 C)   SpO2: 94%     CONSTITUTIONAL: Alert, responds appropriately to questions. Well-appearing; well-nourished HEAD: Normocephalic, atraumatic EYES: Conjunctivae clear, pupils appear equal, sclera nonicteric ENT: normal nose; moist mucous membranes NECK: Supple, normal ROM CARD: Regular and tachycardic; S1 and S2 appreciated RESP: Tachypneic, diminished aeration at bases bilaterally, scattered expiratory wheezing, no rhonchi or rales, satting 100% on nonrebreather ABD/GI: Non-distended; soft, non-tender, no rebound, no guarding, no peritoneal signs BACK: The back appears normal EXT: Normal ROM in all joints; no deformity noted, no edema SKIN: Normal color for age and race; warm; no rash on exposed skin NEURO: Moves all extremities equally, normal speech PSYCH: The patient's mood and manner are appropriate.   ED Results / Procedures / Treatments   LABS: (all labs ordered are listed, but only abnormal results are displayed) Labs Reviewed  BASIC METABOLIC PANEL WITH GFR - Abnormal; Notable for the following components:      Result Value   Potassium 3.1 (*)    Glucose, Bld 181 (*)    Calcium  8.4 (*)    All other components within normal limits  CBC - Abnormal; Notable for the following components:   WBC 13.2 (*)    All other components within normal limits  PROTIME-INR - Abnormal; Notable for the  following components:   Prothrombin Time 16.2 (*)    All other components within normal limits  BRAIN NATRIURETIC PEPTIDE - Abnormal; Notable for the following components:   B Natriuretic Peptide 505.6 (*)    All other components within normal limits  D-DIMER, QUANTITATIVE - Abnormal; Notable for the following components:   D-Dimer, Quant 19.77 (*)    All other components within normal limits  BLOOD GAS, VENOUS - Abnormal; Notable for the following components:   pO2, Ven <31 (*)    Bicarbonate 28.3 (*)    Acid-Base Excess 2.2 (*)    All other components within normal limits  BASIC METABOLIC PANEL WITH GFR - Abnormal; Notable for the following components:   Glucose, Bld 245 (*)    Calcium  8.7 (*)  All other components within normal limits  LACTIC ACID, PLASMA - Abnormal; Notable for the following components:   Lactic Acid, Venous 2.8 (*)    All other components within normal limits  LACTIC ACID, PLASMA - Abnormal; Notable for the following components:   Lactic Acid, Venous 3.8 (*)    All other components within normal limits  GLUCOSE, CAPILLARY - Abnormal; Notable for the following components:   Glucose-Capillary 236 (*)    All other components within normal limits  GLUCOSE, CAPILLARY - Abnormal; Notable for the following components:   Glucose-Capillary 201 (*)    All other components within normal limits  TROPONIN I (HIGH SENSITIVITY) - Abnormal; Notable for the following components:   Troponin I (High Sensitivity) 104 (*)    All other components within normal limits  TROPONIN I (HIGH SENSITIVITY) - Abnormal; Notable for the following components:   Troponin I (High Sensitivity) 122 (*)    All other components within normal limits  TROPONIN I (HIGH SENSITIVITY) - Abnormal; Notable for the following components:   Troponin I (High Sensitivity) 125 (*)    All other components within normal limits  TROPONIN I (HIGH SENSITIVITY) - Abnormal; Notable for the following components:    Troponin I (High Sensitivity) 109 (*)    All other components within normal limits  RESP PANEL BY RT-PCR (RSV, FLU A&B, COVID)  RVPGX2  MRSA NEXT GEN BY PCR, NASAL  APTT  CBC  MAGNESIUM   PHOSPHORUS  HEPARIN  LEVEL (UNFRACTIONATED)  ANTITHROMBIN III   PROTEIN C ACTIVITY  PROTEIN C, TOTAL  PROTEIN S ACTIVITY  PROTEIN S, TOTAL  LUPUS ANTICOAGULANT PANEL  FACTOR 5 LEIDEN  PROTHROMBIN GENE MUTATION  CARDIOLIPIN ANTIBODIES, IGG, IGM, IGA  HOMOCYSTEINE  BETA-2 -GLYCOPROTEIN I ABS, IGG/M/A  HEMOGLOBIN A1C     EKG:  EKG Interpretation Date/Time:  Wednesday February 09 2024 01:21:12 EDT Ventricular Rate:  112 PR Interval:  166 QRS Duration:  107 QT Interval:  308 QTC Calculation: 421 R Axis:   104  Text Interpretation: Sinus tachycardia Right axis deviation Nonspecific T abnormalities, inferior leads Confirmed by Neomi Neptune [phone]) on 02/09/2024 1:30:30 AM         RADIOLOGY: My personal review and interpretation of imaging: CT scan shows submassive bilateral PEs with right heart strain.  I have personally reviewed all radiology reports.   PERIPHERAL VASCULAR CATHETERIZATION Result Date: 02/09/2024 See surgical note for result.  CT Angio Chest PE W and/or Wo Contrast Addendum Date: 02/09/2024 ADDENDUM REPORT: 02/09/2024 05:08 ADDENDUM: Critical Value/emergent results were called by telephone at the time of interpretation on 02/09/2024 at 1:42 am to provider [facility] , who verbally acknowledged these results. Electronically Signed   By: Francis Quam M.D.   On: 02/09/2024 05:08   Result Date: 02/09/2024 CLINICAL DATA:  Positive D-dimer, weakness, and shortness of breath. Hypoxia on EMS arrival. EXAM: CT ANGIOGRAPHY CHEST WITH CONTRAST TECHNIQUE: Multidetector CT imaging of the chest was performed using the standard protocol during bolus administration of intravenous contrast. Multiplanar CT image reconstructions and MIPs were obtained to evaluate the vascular anatomy.  RADIATION DOSE REDUCTION: This exam was performed according to the departmental dose-optimization program which includes automated exposure control, adjustment of the mA and/or kV according to patient size and/or use of iterative reconstruction technique. CONTRAST:  75mL OMNIPAQUE  IOHEXOL  350 MG/ML SOLN COMPARISON:  CT abdomen pelvis with IV contrast 01/05/2024, portable chest today, PA Lat chest 10/04/2009. No prior chest CT or CTA. FINDINGS: Cardiovascular: Large volume of bilateral upper and lower  lobe arterial embolic disease with right heart strain. There is mild cardiomegaly with a right chamber predominance, IVC and hepatic vein reflux, and bowing of the interventricular septum to the left, all in keeping with right heart strain. There is a band of thrombus occupying the right atrium, a prominent pulmonary trunk measuring 3.2 cm, linear thrombus in the right main pulmonary artery, and nearly occlusive thrombus in both distal main pulmonary arteries. There are near occlusive emboli in both upper and lower lobe main arteries and the interlobar pulmonary artery, near occlusive segmental emboli in the right middle lobe, and a combination of occlusive and near occlusive multifocal segmental and subsegmental emboli in the upper and lower lobes. There is a small pericardial effusion new from 01/05/2024. the pulmonary veins are normal caliber. The aorta is tortuous with trace calcific plaques in the descending segment. There is no aneurysm or dissection. The great vessels are unremarkable. Mediastinum/Nodes: No enlarged mediastinal, hilar, or axillary lymph nodes. Thyroid gland, trachea, and esophagus demonstrate no significant findings. Lungs/Pleura: There is a new pleural-based opacity posteriorly in the right lower lobe which is probably a developing infarct. There is a 6 mm right lower lobe nodule in the superior segment on 5:76 which is above the plane of all prior abdomen and pelvis CTs. Remaining bilateral  lungs are essentially clear. There are trace pleural effusions. There is chronic linear scarring in the base of the lingula and base of the right middle lobe. Upper Abdomen: No acute abnormality. Abdominal aortic atherosclerosis. Musculoskeletal: There is degenerative change and kyphosis of the thoracic spine, slight dextroscoliosis. No acute or significant osseous abnormality. Unremarkable chest wall. Review of the MIP images confirms the above findings. IMPRESSION: 1. Large volume of bilateral upper and lower lobe arterial embolic disease with right heart strain. CT evidence of right heart strain (RV/LV Ratio = 1.37) consistent with at least submassive (intermediate risk) PE. The presence of right heart strain has been associated with an increased risk of morbidity and mortality. 2. Right atrial thrombus, prominent pulmonary trunk, and linear thrombus in the right main pulmonary artery. 3. Near occlusive emboli in both upper and lower lobe main arteries and the interlobar pulmonary artery, near occlusive segmental emboli in the right middle lobe, and a combination of occlusive and near occlusive multifocal segmental and subsegmental emboli in the upper and lower lobes. 4. New pleural-based opacity posteriorly in the right lower lobe which is probably a developing infarct. 5. 6 mm right lower lobe nodule. Non-contrast chest CT at 6-12 months is recommended. If the nodule is stable at time of repeat CT, then future CT at 18-24 months (from today's scan) is considered optional for low-risk patients, but is recommended for high-risk patients. This recommendation follows the consensus statement: Guidelines for Management of Incidental Pulmonary Nodules Detected on CT Images: From the Fleischner Society 2017; Radiology 2017; 284:228-243. 6. Trace pleural effusions. 7. Aortic atherosclerosis. 8. PRA is attempting to reach the ordering physician for stat notification at the time of signing. Aortic Atherosclerosis  (ICD10-I70.0). Electronically Signed: By: Francis Quam M.D. On: 02/09/2024 01:36   US  Venous Img Lower Bilateral (DVT) Result Date: 02/09/2024 CLINICAL DATA:  Large volume bilateral pulmonary embolic burden. Assess for DVT. [id number]. EXAM: BILATERAL LOWER EXTREMITY VENOUS DOPPLER ULTRASOUND TECHNIQUE: Gray-scale sonography with graded compression, as well as color Doppler and duplex ultrasound were performed to evaluate the lower extremity deep venous systems from the level of the common femoral vein and including the common femoral, femoral, profunda femoral, popliteal  and calf veins including the posterior tibial, peroneal and gastrocnemius veins when visible. The superficial great saphenous vein was also interrogated. Spectral Doppler was utilized to evaluate flow at rest and with distal augmentation maneuvers in the common femoral, femoral and popliteal veins. COMPARISON:  None Available. FINDINGS: RIGHT LOWER EXTREMITY Common Femoral Vein: No evidence of thrombus. Normal compressibility, respiratory phasicity and response to augmentation. Saphenofemoral Junction: No evidence of thrombus. Normal compressibility and flow on color Doppler imaging. Profunda Femoral Vein: No evidence of thrombus. Normal compressibility and flow on color Doppler imaging. Femoral Vein: No evidence of thrombus. Normal compressibility, respiratory phasicity and response to augmentation. Popliteal Vein: No evidence of thrombus. Normal compressibility, respiratory phasicity and response to augmentation. Calf Veins: No evidence of thrombus. Normal compressibility and flow on color Doppler imaging. Superficial Great Saphenous Vein: No evidence of thrombus. Normal compressibility. Venous Reflux:  None. Other Findings:  None. LEFT LOWER EXTREMITY Common Femoral Vein: No evidence of thrombus. Normal compressibility, respiratory phasicity and response to augmentation. Saphenofemoral Junction: No evidence of thrombus. Normal compressibility  and flow on color Doppler imaging. Profunda Femoral Vein: No evidence of thrombus. Normal compressibility and flow on color Doppler imaging. Femoral Vein: No evidence of thrombus. Normal compressibility, respiratory phasicity and response to augmentation. Popliteal Vein: No evidence of thrombus. Normal compressibility, respiratory phasicity and response to augmentation. Calf Veins: No evidence of thrombus. Normal compressibility and flow on color Doppler imaging. Superficial Great Saphenous Vein: No evidence of thrombus. Normal compressibility. Venous Reflux:  None. Other Findings:  None. IMPRESSION: No evidence of deep venous thrombosis in either lower extremity. Electronically Signed   By: Francis Quam M.D.   On: 02/09/2024 05:07   DG Chest 1 View Result Date: 02/09/2024 CLINICAL DATA:  Shortness of breath and fall EXAM: CHEST  1 VIEW COMPARISON:  10/04/2009 FINDINGS: Stable cardiomediastinal silhouette. Bibasilar atelectasis. The lungs are otherwise clear. No pleural effusion or pneumothorax. No displaced rib fracture. IMPRESSION: No active disease. Electronically Signed   By: Norman Gatlin M.D.   On: 02/09/2024 00:05     PROCEDURES:  Critical Care performed: Yes, see critical care procedure note(s)   CRITICAL CARE Performed by: Josette Zuhair Lariccia   Total critical care time: 30 minutes  Critical care time was exclusive of separately billable procedures and treating other patients.  Critical care was necessary to treat or prevent imminent or life-threatening deterioration.  Critical care was time spent personally by me on the following activities: development of treatment plan with patient and/or surrogate as well as nursing, discussions with consultants, evaluation of patient's response to treatment, examination of patient, obtaining history from patient or surrogate, ordering and performing treatments and interventions, ordering and review of laboratory studies, ordering and review of  radiographic studies, pulse oximetry and re-evaluation of patient's condition.   SABRA1-3 Lead EKG Interpretation  Performed by: Avaleen Brownley, Josette SAILOR, DO Authorized by: Santanna Olenik, Josette SAILOR, DO     Interpretation: abnormal     ECG rate:  115   ECG rate assessment: tachycardic     Rhythm: sinus tachycardia     Ectopy: none     Conduction: normal       IMPRESSION / MDM / ASSESSMENT AND PLAN / ED COURSE  I reviewed the triage vital signs and the nursing notes.    Patient here with shortness of breath, hypoxia.  History of COPD per his report.  Recent admission.  No prior history of PE, DVT, lower extremity swelling or pain.  Denies history of CHF.  The patient is on the cardiac monitor to evaluate for evidence of arrhythmia and/or significant heart rate changes.   DIFFERENTIAL DIAGNOSIS (includes but not limited to):   COPD exacerbation, PE given recent hospitalization, pneumothorax, CHF, pneumonia   Patient's presentation is most consistent with acute presentation with potential threat to life or bodily function.   PLAN: Will obtain cardiac labs, BNP to rule out CHF, D-dimer to evaluate for PE given recent hospitalization.  Will give breathing treatments, Solu-Medrol  as he is wheezing here.  Anticipate admission given new oxygen requirement.   MEDICATIONS GIVEN IN ED: Medications  potassium chloride  10 mEq in 100 mL IVPB (0 mEq Intravenous Stopped 02/09/24 0254)  Chlorhexidine  Gluconate Cloth 2 % PADS 6 each ( Topical Automatically Held 02/17/24 1000)  docusate sodium  (COLACE) capsule 100 mg ( Oral MAR Hold 02/09/24 0728)  polyethylene glycol (MIRALAX  / GLYCOLAX ) packet 17 g ( Oral MAR Hold 02/09/24 0728)  acetaminophen  (TYLENOL ) tablet 650 mg ( Oral MAR Hold 02/09/24 0728)  heparin  ADULT infusion 100 units/mL (25000 units/250mL) (1,450 Units/hr Intravenous Restarted 02/09/24 0841)  pantoprazole  (PROTONIX ) EC tablet 80 mg ( Oral Automatically Held 02/17/24 1000)  Heparin  (Porcine) in NaCl  1000-0.9 UT/500ML-% SOLN (1,000 mLs  Given 02/09/24 0821)  iodixanol  (VISIPAQUE ) 320 MG/ML injection (50 mLs  Given 02/09/24 0823)  lidocaine -EPINEPHrine  (PF) (XYLOCAINE -EPINEPHrine ) 1 %-1:200000 (PF) injection (10 mLs  Given 02/09/24 0823)  ipratropium-albuterol  (DUONEB) 0.5-2.5 (3) MG/3ML nebulizer solution 3 mL (3 mLs Nebulization Given 02/08/24 2353)  methylPREDNISolone  sodium succinate (SOLU-MEDROL ) 125 mg/2 mL injection 125 mg (125 mg Intravenous Given 02/08/24 2354)  ipratropium-albuterol  (DUONEB) 0.5-2.5 (3) MG/3ML nebulizer solution 3 mL (3 mLs Nebulization Given 02/09/24 0019)  iohexol  (OMNIPAQUE ) 350 MG/ML injection 75 mL (75 mLs Intravenous Contrast Given 02/09/24 0057)  acetaminophen  (TYLENOL ) tablet 1,000 mg (1,000 mg Oral Given 02/09/24 0140)  sodium chloride  0.9 % bolus 1,000 mL (1,000 mLs Intravenous New Bag/Given 02/09/24 0159)  heparin  bolus via infusion 5,500 Units (5,500 Units Intravenous Bolus from Bag 02/09/24 0309)  potassium chloride  SA (KLOR-CON  M) CR tablet 40 mEq (40 mEq Oral Given 02/09/24 0335)  potassium chloride  10 mEq in 100 mL IVPB (0 mEq Intravenous Stopped 02/09/24 0536)  ceFAZolin  (ANCEF ) IVPB 2g/100 mL premix (0 g Intravenous Stopped 02/09/24 0800)     ED COURSE: Patient's labs show a leukocytosis of 13,000 although this appears somewhat chronic.  He denies any fevers or productive cough.  Troponin elevated at 104.  BNP is 505.  D-dimer is 19.77.  Chest x-ray reviewed and interpreted by myself and the radiologist and is unremarkable.  Will obtain CTA of the chest for further evaluation.  We have been able to transition him to nasal cannula and he is doing well on 4 L.  He has had some intermittent low blood pressures.  Will give IV fluids and continue to monitor.  Will give aspirin  for elevated troponin.  Given hypotension, elevated D-dimer and elevated troponin, I am concerned for PE with right heart strain especially given he would be at risk due to recent  hospitalization.   CT scan reviewed and interpreted by myself and the radiologist and shows bilateral PEs, submassive in nature with right heart strain.  Will discuss with ICU for admission given intermittent hypotension.  Will also discuss with vascular surgery.      CONSULTS: Discussed with Jenita Ruth Dannielle Fret, NP with ICU for admission.  Per Orvin Daring, NP and Dr. Marea with vascular surgery, they will plan to take patient for  thrombectomy first thing in the morning.  They would like to avoid TNK unless patient became rapidly unstable but if he does worsen in any way with worsening hypotension or increasing oxygen demands they would like to be reconsulted to try to take him to thrombectomy earlier.  They agree with starting heparin .  Will keep him NPO.  Appreciate vascular surgery help.    OUTSIDE RECORDS REVIEWED: Reviewed recent admission notes.       FINAL CLINICAL IMPRESSION(S) / ED DIAGNOSES   Final diagnoses:  Bilateral pulmonary embolism (HCC)  Cor pulmonale (HCC)     Rx / DC Orders   ED Discharge Orders     None        Note:  This document was prepared using Dragon voice recognition software and may include unintentional dictation errors.   Emerson Barretto, Josette SAILOR, DO 02/09/24 [phone]

## 2024-02-08 NOTE — ED Triage Notes (Signed)
 Pt brought in by EMS from home, reports not feeling well x2 days with weakness/sob. Tonight pt missed chair and landed on floor, O2 80% on RA on EMS arrival, placed on 15L NRB. 600NS, 324mg  aspirin  given by EMS. AxOx4 in triage.

## 2024-02-09 ENCOUNTER — Inpatient Hospital Stay

## 2024-02-09 ENCOUNTER — Encounter: Admission: EM | Disposition: A | Discharge status: Home Health | Payer: Self-pay | Source: Home / Self Care

## 2024-02-09 ENCOUNTER — Telehealth (HOSPITAL_COMMUNITY): Payer: Self-pay | Admitting: Pharmacy Technician

## 2024-02-09 ENCOUNTER — Other Ambulatory Visit (HOSPITAL_COMMUNITY): Payer: Self-pay

## 2024-02-09 ENCOUNTER — Encounter: Payer: Self-pay | Admitting: Vascular Surgery

## 2024-02-09 ENCOUNTER — Inpatient Hospital Stay
Admit: 2024-02-09 | Discharge: 2024-02-09 | Disposition: A | Discharge status: Home / Self Care | Attending: Pulmonary Disease | Admitting: Pulmonary Disease

## 2024-02-09 ENCOUNTER — Emergency Department

## 2024-02-09 ENCOUNTER — Inpatient Hospital Stay (HOSPITAL_COMMUNITY)
Admit: 2024-02-09 | Discharge: 2024-02-09 | Disposition: A | Discharge status: Home / Self Care | Attending: Vascular Surgery | Admitting: Vascular Surgery

## 2024-02-09 DIAGNOSIS — Z9889 Other specified postprocedural states: Secondary | ICD-10-CM | POA: Diagnosis not present

## 2024-02-09 DIAGNOSIS — R7989 Other specified abnormal findings of blood chemistry: Secondary | ICD-10-CM

## 2024-02-09 DIAGNOSIS — I2489 Other forms of acute ischemic heart disease: Secondary | ICD-10-CM | POA: Diagnosis present

## 2024-02-09 DIAGNOSIS — I2781 Cor pulmonale (chronic): Secondary | ICD-10-CM

## 2024-02-09 DIAGNOSIS — I50810 Right heart failure, unspecified: Secondary | ICD-10-CM

## 2024-02-09 DIAGNOSIS — I2699 Other pulmonary embolism without acute cor pulmonale: Secondary | ICD-10-CM | POA: Diagnosis not present

## 2024-02-09 DIAGNOSIS — E785 Hyperlipidemia, unspecified: Secondary | ICD-10-CM | POA: Diagnosis present

## 2024-02-09 DIAGNOSIS — I2609 Other pulmonary embolism with acute cor pulmonale: Secondary | ICD-10-CM

## 2024-02-09 DIAGNOSIS — D72829 Elevated white blood cell count, unspecified: Secondary | ICD-10-CM

## 2024-02-09 DIAGNOSIS — R0603 Acute respiratory distress: Secondary | ICD-10-CM | POA: Diagnosis present

## 2024-02-09 DIAGNOSIS — Z85828 Personal history of other malignant neoplasm of skin: Secondary | ICD-10-CM | POA: Diagnosis not present

## 2024-02-09 DIAGNOSIS — J9601 Acute respiratory failure with hypoxia: Secondary | ICD-10-CM | POA: Diagnosis present

## 2024-02-09 DIAGNOSIS — Z860100 Personal history of colon polyps, unspecified: Secondary | ICD-10-CM | POA: Diagnosis not present

## 2024-02-09 DIAGNOSIS — I10 Essential (primary) hypertension: Secondary | ICD-10-CM

## 2024-02-09 DIAGNOSIS — J449 Chronic obstructive pulmonary disease, unspecified: Secondary | ICD-10-CM | POA: Diagnosis present

## 2024-02-09 DIAGNOSIS — Z79899 Other long term (current) drug therapy: Secondary | ICD-10-CM | POA: Diagnosis not present

## 2024-02-09 DIAGNOSIS — E8770 Fluid overload, unspecified: Secondary | ICD-10-CM | POA: Diagnosis present

## 2024-02-09 DIAGNOSIS — Z1152 Encounter for screening for COVID-19: Secondary | ICD-10-CM | POA: Diagnosis not present

## 2024-02-09 DIAGNOSIS — Z882 Allergy status to sulfonamides status: Secondary | ICD-10-CM | POA: Diagnosis not present

## 2024-02-09 DIAGNOSIS — G4700 Insomnia, unspecified: Secondary | ICD-10-CM | POA: Diagnosis present

## 2024-02-09 DIAGNOSIS — Z91040 Latex allergy status: Secondary | ICD-10-CM | POA: Diagnosis not present

## 2024-02-09 DIAGNOSIS — Z7901 Long term (current) use of anticoagulants: Secondary | ICD-10-CM | POA: Diagnosis not present

## 2024-02-09 DIAGNOSIS — F419 Anxiety disorder, unspecified: Secondary | ICD-10-CM | POA: Diagnosis present

## 2024-02-09 DIAGNOSIS — E876 Hypokalemia: Secondary | ICD-10-CM | POA: Diagnosis present

## 2024-02-09 HISTORY — PX: PULMONARY THROMBECTOMY: CATH118295

## 2024-02-09 LAB — COMPREHENSIVE METABOLIC PANEL WITH GFR
ALT: 24 U/L (ref 0–44)
AST: 26 U/L (ref 15–41)
Albumin: 2.9 g/dL — ABNORMAL LOW (ref 3.5–5.0)
Alkaline Phosphatase: 50 U/L (ref 38–126)
Anion gap: 10 (ref 5–15)
BUN: 21 mg/dL (ref 8–23)
CO2: 20 mmol/L — ABNORMAL LOW (ref 22–32)
Calcium: 8.4 mg/dL — ABNORMAL LOW (ref 8.9–10.3)
Chloride: 109 mmol/L (ref 98–111)
Creatinine, Ser: 1.08 mg/dL (ref 0.61–1.24)
GFR, Estimated: >60 mL/min (ref >60)
Glucose, Bld: 204 mg/dL — ABNORMAL HIGH (ref 70–99)
Potassium: 4.4 mmol/L (ref 3.5–5.1)
Sodium: 139 mmol/L (ref 135–145)
Total Bilirubin: 0.5 mg/dL (ref 0.0–1.2)
Total Protein: 6.2 g/dL — ABNORMAL LOW (ref 6.5–8.1)

## 2024-02-09 LAB — CBC WITH DIFFERENTIAL/PLATELET
Abs Immature Granulocytes: 0.03 K/uL (ref 0.00–0.07)
Basophils Absolute: 0 K/uL (ref 0.0–0.1)
Basophils Relative: 0 %
Eosinophils Absolute: 0 K/uL (ref 0.0–0.5)
Eosinophils Relative: 0 %
HCT: 41.3 % (ref 39.0–52.0)
Hemoglobin: 13.1 g/dL (ref 13.0–17.0)
Immature Granulocytes: 0 %
Lymphocytes Relative: 14 %
Lymphs Abs: 1.1 K/uL (ref 0.7–4.0)
MCH: 30.9 pg (ref 26.0–34.0)
MCHC: 31.7 g/dL (ref 30.0–36.0)
MCV: 97.4 fL (ref 80.0–100.0)
Monocytes Absolute: 0.3 K/uL (ref 0.1–1.0)
Monocytes Relative: 4 %
Neutro Abs: 6.3 K/uL (ref 1.7–7.7)
Neutrophils Relative %: 82 %
Platelets: 194 K/uL (ref 150–400)
RBC: 4.24 MIL/uL (ref 4.22–5.81)
RDW: 13.4 % (ref 11.5–15.5)
WBC: 7.7 K/uL (ref 4.0–10.5)
nRBC: 0 % (ref 0.0–0.2)

## 2024-02-09 LAB — CBC
HCT: 43.5 % (ref 39.0–52.0)
HCT: 44.2 % (ref 39.0–52.0)
Hemoglobin: 14 g/dL (ref 13.0–17.0)
Hemoglobin: 14.2 g/dL (ref 13.0–17.0)
MCH: 31.6 pg (ref 26.0–34.0)
MCH: 31.7 pg (ref 26.0–34.0)
MCHC: 32.1 g/dL (ref 30.0–36.0)
MCHC: 32.2 g/dL (ref 30.0–36.0)
MCV: 98.4 fL (ref 80.0–100.0)
MCV: 98.6 fL (ref 80.0–100.0)
Platelets: 189 K/uL (ref 150–400)
Platelets: 190 K/uL (ref 150–400)
RBC: 4.41 MIL/uL (ref 4.22–5.81)
RBC: 4.49 MIL/uL (ref 4.22–5.81)
RDW: 13.2 % (ref 11.5–15.5)
RDW: 13.3 % (ref 11.5–15.5)
WBC: 13.2 K/uL — ABNORMAL HIGH (ref 4.0–10.5)
WBC: 9.5 K/uL (ref 4.0–10.5)
nRBC: 0 % (ref 0.0–0.2)
nRBC: 0 % (ref 0.0–0.2)

## 2024-02-09 LAB — LACTIC ACID, PLASMA
Lactic Acid, Venous: 2.8 mmol/L (ref 0.5–1.9)
Lactic Acid, Venous: 3.8 mmol/L (ref 0.5–1.9)
Lactic Acid, Venous: 3.2 mmol/L (ref 0.5–1.9)
Lactic Acid, Venous: 2.5 mmol/L (ref 0.5–1.9)

## 2024-02-09 LAB — BASIC METABOLIC PANEL WITH GFR
Anion gap: 10 (ref 5–15)
Anion gap: 10 (ref 5–15)
BUN: 20 mg/dL (ref 8–23)
BUN: 20 mg/dL (ref 8–23)
CO2: 23 mmol/L (ref 22–32)
CO2: 26 mmol/L (ref 22–32)
Calcium: 8.4 mg/dL — ABNORMAL LOW (ref 8.9–10.3)
Calcium: 8.7 mg/dL — ABNORMAL LOW (ref 8.9–10.3)
Chloride: 105 mmol/L (ref 98–111)
Chloride: 105 mmol/L (ref 98–111)
Creatinine, Ser: 1.14 mg/dL (ref 0.61–1.24)
Creatinine, Ser: 1.16 mg/dL (ref 0.61–1.24)
GFR, Estimated: >60 mL/min (ref >60)
GFR, Estimated: >60 mL/min (ref >60)
Glucose, Bld: 181 mg/dL — ABNORMAL HIGH (ref 70–99)
Glucose, Bld: 245 mg/dL — ABNORMAL HIGH (ref 70–99)
Potassium: 3.1 mmol/L — ABNORMAL LOW (ref 3.5–5.1)
Potassium: 4.2 mmol/L (ref 3.5–5.1)
Sodium: 138 mmol/L (ref 135–145)
Sodium: 141 mmol/L (ref 135–145)

## 2024-02-09 LAB — TROPONIN I (HIGH SENSITIVITY)
Troponin I (High Sensitivity): 104 ng/L (ref <18)
Troponin I (High Sensitivity): 109 ng/L (ref <18)
Troponin I (High Sensitivity): 122 ng/L (ref <18)
Troponin I (High Sensitivity): 125 ng/L (ref <18)
Troponin I (High Sensitivity): 190 ng/L (ref <18)
Troponin I (High Sensitivity): 202 ng/L (ref <18)

## 2024-02-09 LAB — MRSA NEXT GEN BY PCR, NASAL: MRSA by PCR Next Gen: NOT DETECTED

## 2024-02-09 LAB — ECHOCARDIOGRAM COMPLETE
AR max vel: 2.83 cm2
AV Area VTI: 2.62 cm2
AV Area mean vel: 2.62 cm2
AV Mean grad: 2 mmHg
AV Peak grad: 3.1 mmHg
Ao pk vel: 0.88 m/s
Area-P 1/2: 4.49 cm2
Calc EF: 66.5 %
Height: 73 in
S' Lateral: 2.3 cm
Single Plane A2C EF: 74.7 %
Single Plane A4C EF: 52.8 %
Weight: 3414.48 [oz_av]

## 2024-02-09 LAB — GLUCOSE, CAPILLARY
Glucose-Capillary: 236 mg/dL — ABNORMAL HIGH (ref 70–99)
Glucose-Capillary: 182 mg/dL — ABNORMAL HIGH (ref 70–99)
Glucose-Capillary: 201 mg/dL — ABNORMAL HIGH (ref 70–99)
Glucose-Capillary: 187 mg/dL — ABNORMAL HIGH (ref 70–99)
Glucose-Capillary: 156 mg/dL — ABNORMAL HIGH (ref 70–99)
Glucose-Capillary: 154 mg/dL — ABNORMAL HIGH (ref 70–99)
Glucose-Capillary: 120 mg/dL — ABNORMAL HIGH (ref 70–99)

## 2024-02-09 LAB — RESP PANEL BY RT-PCR (RSV, FLU A&B, COVID)  RVPGX2
Influenza A by PCR: NEGATIVE
Influenza B by PCR: NEGATIVE
Resp Syncytial Virus by PCR: NEGATIVE
SARS Coronavirus 2 by RT PCR: NEGATIVE

## 2024-02-09 LAB — MAGNESIUM: Magnesium: 2.3 mg/dL (ref 1.7–2.4)

## 2024-02-09 LAB — PHOSPHORUS: Phosphorus: 2.6 mg/dL (ref 2.5–4.6)

## 2024-02-09 LAB — PROTIME-INR
INR: 1.2 (ref 0.8–1.2)
Prothrombin Time: 16.2 s — ABNORMAL HIGH (ref 11.4–15.2)

## 2024-02-09 LAB — BRAIN NATRIURETIC PEPTIDE
B Natriuretic Peptide: 505.6 pg/mL — ABNORMAL HIGH (ref 0.0–100.0)
B Natriuretic Peptide: 830.7 pg/mL — ABNORMAL HIGH (ref 0.0–100.0)

## 2024-02-09 LAB — ANTITHROMBIN III: AntiThromb III Func: 88 % (ref 75–120)

## 2024-02-09 LAB — BLOOD GAS, VENOUS

## 2024-02-09 LAB — HEPARIN LEVEL (UNFRACTIONATED): Heparin Unfractionated: 0.53 [IU]/mL (ref 0.30–0.70)

## 2024-02-09 LAB — APTT: aPTT: 31 s (ref 24–36)

## 2024-02-09 LAB — HEMOGLOBIN A1C
Hgb A1c MFr Bld: 5.7 % — ABNORMAL HIGH (ref 4.8–5.6)
Mean Plasma Glucose: 116.89 mg/dL

## 2024-02-09 LAB — D-DIMER, QUANTITATIVE: D-Dimer, Quant: 19.77 ug{FEU}/mL — ABNORMAL HIGH (ref 0.00–0.50)

## 2024-02-09 SURGERY — PULMONARY THROMBECTOMY
Anesthesia: Moderate Sedation | Laterality: Bilateral

## 2024-02-09 MED ORDER — IODIXANOL 320 MG/ML IV SOLN
INTRAVENOUS | Status: DC | PRN
  Administered 2024-02-09: 50 mL

## 2024-02-09 MED ORDER — CEFAZOLIN SODIUM-DEXTROSE 2-4 GM/100ML-% IV SOLN
INTRAVENOUS | Status: AC
  Filled 2024-02-09: qty 100

## 2024-02-09 MED ORDER — HEPARIN BOLUS VIA INFUSION
5500.0000 [IU] | Freq: Once | INTRAVENOUS | Status: AC
  Administered 2024-02-09: 5500 [IU] via INTRAVENOUS
  Filled 2024-02-09: qty 5500

## 2024-02-09 MED ORDER — INSULIN ASPART 100 UNIT/ML IJ SOLN
1.0000 [IU] | INTRAMUSCULAR | Status: DC
  Administered 2024-02-09 (×3): 2 [IU] via SUBCUTANEOUS
  Administered 2024-02-10 – 2024-02-11 (×3): 1 [IU] via SUBCUTANEOUS
  Filled 2024-02-09 (×6): qty 1

## 2024-02-09 MED ORDER — IPRATROPIUM-ALBUTEROL 0.5-2.5 (3) MG/3ML IN SOLN
3.0000 mL | Freq: Once | RESPIRATORY_TRACT | Status: AC
  Administered 2024-02-09: 3 mL via RESPIRATORY_TRACT
  Filled 2024-02-09: qty 3

## 2024-02-09 MED ORDER — ACETAMINOPHEN 325 MG PO TABS
650.0000 mg | ORAL_TABLET | ORAL | Status: DC | PRN
  Administered 2024-02-09 – 2024-02-11 (×4): 650 mg via ORAL
  Filled 2024-02-09 (×4): qty 2

## 2024-02-09 MED ORDER — SODIUM CHLORIDE 0.9 % IV BOLUS (SEPSIS)
1000.0000 mL | Freq: Once | INTRAVENOUS | Status: AC
  Administered 2024-02-09: 1000 mL via INTRAVENOUS

## 2024-02-09 MED ORDER — CHLORHEXIDINE GLUCONATE CLOTH 2 % EX PADS
6.0000 | MEDICATED_PAD | Freq: Every day | CUTANEOUS | Status: DC
  Administered 2024-02-09 – 2024-02-11 (×3): 6 via TOPICAL

## 2024-02-09 MED ORDER — MIDAZOLAM HCL 5 MG/5ML IJ SOLN
INTRAMUSCULAR | Status: AC
  Filled 2024-02-09: qty 5

## 2024-02-09 MED ORDER — MIDAZOLAM HCL 2 MG/2ML IJ SOLN
INTRAMUSCULAR | Status: DC | PRN
  Administered 2024-02-09: 1 mg via INTRAVENOUS
  Administered 2024-02-09: .5 mg via INTRAVENOUS

## 2024-02-09 MED ORDER — PANTOPRAZOLE SODIUM 40 MG PO TBEC
80.0000 mg | DELAYED_RELEASE_TABLET | Freq: Every day | ORAL | Status: DC
  Administered 2024-02-09: 80 mg via ORAL
  Filled 2024-02-09: qty 2

## 2024-02-09 MED ORDER — HEPARIN SODIUM (PORCINE) 1000 UNIT/ML IJ SOLN
INTRAMUSCULAR | Status: AC
  Filled 2024-02-09: qty 10

## 2024-02-09 MED ORDER — PANTOPRAZOLE SODIUM 40 MG PO TBEC
40.0000 mg | DELAYED_RELEASE_TABLET | Freq: Every day | ORAL | Status: DC
  Administered 2024-02-10 – 2024-02-12 (×3): 40 mg via ORAL
  Filled 2024-02-09 (×3): qty 1

## 2024-02-09 MED ORDER — CEFAZOLIN SODIUM-DEXTROSE 2-4 GM/100ML-% IV SOLN
2.0000 g | Freq: Once | INTRAVENOUS | Status: AC
  Administered 2024-02-09: 2 g via INTRAVENOUS

## 2024-02-09 MED ORDER — HEPARIN (PORCINE) 25000 UT/250ML-% IV SOLN
1450.0000 [IU]/h | INTRAVENOUS | Status: DC
  Administered 2024-02-09 (×2): 1450 [IU]/h via INTRAVENOUS
  Filled 2024-02-09 (×2): qty 250

## 2024-02-09 MED ORDER — POLYETHYLENE GLYCOL 3350 17 G PO PACK: 17.0000 g | PACK | Freq: Every day | ORAL | Status: DC | PRN

## 2024-02-09 MED ORDER — HEPARIN (PORCINE) IN NACL 1000-0.9 UT/500ML-% IV SOLN
INTRAVENOUS | Status: DC | PRN
  Administered 2024-02-09: 1000 mL

## 2024-02-09 MED ORDER — FENTANYL CITRATE (PF) 100 MCG/2ML IJ SOLN
INTRAMUSCULAR | Status: AC
  Filled 2024-02-09: qty 2

## 2024-02-09 MED ORDER — PERFLUTREN LIPID MICROSPHERE
1.0000 mL | INTRAVENOUS | Status: AC | PRN
  Administered 2024-02-09: 2 mL via INTRAVENOUS

## 2024-02-09 MED ORDER — FENTANYL CITRATE (PF) 100 MCG/2ML IJ SOLN
INTRAMUSCULAR | Status: DC | PRN
Start: 2024-02-09 — End: 2024-02-09
  Administered 2024-02-09: 50 ug via INTRAVENOUS

## 2024-02-09 MED ORDER — SODIUM CHLORIDE 0.9 % IV BOLUS
INTRAVENOUS | Status: DC | PRN
  Administered 2024-02-09: 250 mL via INTRAVENOUS

## 2024-02-09 MED ORDER — ASPIRIN 81 MG PO CHEW: 324.0000 mg | CHEWABLE_TABLET | Freq: Once | ORAL | Status: DC

## 2024-02-09 MED ORDER — HEPARIN SODIUM (PORCINE) 1000 UNIT/ML IJ SOLN
INTRAMUSCULAR | Status: DC | PRN
  Administered 2024-02-09: 3000 [IU] via INTRAVENOUS

## 2024-02-09 MED ORDER — POTASSIUM CHLORIDE CRYS ER 20 MEQ PO TBCR
40.0000 meq | EXTENDED_RELEASE_TABLET | Freq: Once | ORAL | Status: AC
  Administered 2024-02-09: 40 meq via ORAL
  Filled 2024-02-09: qty 2

## 2024-02-09 MED ORDER — POTASSIUM CHLORIDE 10 MEQ/100ML IV SOLN
10.0000 meq | INTRAVENOUS | Status: AC
  Administered 2024-02-09: 10 meq via INTRAVENOUS
  Filled 2024-02-09: qty 100

## 2024-02-09 MED ORDER — INSULIN ASPART 100 UNIT/ML IJ SOLN: 0.0000 [IU] | INTRAMUSCULAR | Status: DC

## 2024-02-09 MED ORDER — IOHEXOL 350 MG/ML SOLN
75.0000 mL | Freq: Once | INTRAVENOUS | Status: AC | PRN
  Administered 2024-02-09: 75 mL via INTRAVENOUS

## 2024-02-09 MED ORDER — LIDOCAINE-EPINEPHRINE (PF) 1 %-1:200000 IJ SOLN
INTRAMUSCULAR | Status: DC | PRN
Start: 2024-02-09 — End: 2024-02-09
  Administered 2024-02-09: 10 mL

## 2024-02-09 MED ORDER — DOCUSATE SODIUM 100 MG PO CAPS: 100.0000 mg | ORAL_CAPSULE | Freq: Two times a day (BID) | ORAL | Status: DC | PRN

## 2024-02-09 MED ORDER — ACETAMINOPHEN 500 MG PO TABS
1000.0000 mg | ORAL_TABLET | Freq: Once | ORAL | Status: AC
  Administered 2024-02-09: 1000 mg via ORAL
  Filled 2024-02-09: qty 2

## 2024-02-09 MED ORDER — POTASSIUM CHLORIDE 10 MEQ/100ML IV SOLN
10.0000 meq | Freq: Once | INTRAVENOUS | Status: AC
  Administered 2024-02-09: 10 meq via INTRAVENOUS
  Filled 2024-02-09: qty 100

## 2024-02-09 SURGICAL SUPPLY — 19 items
CANISTER PENUMBRA ENGINE (MISCELLANEOUS) IMPLANT
CATH ANGIO 5F 100CM .035 PIG (CATHETERS) IMPLANT
CATH INDIGO 12XTORQ 100 (CATHETERS) IMPLANT
CATH INDIGO SEP 12 (CATHETERS) IMPLANT
CATH SELECT BERN TIP 5F 130 (CATHETERS) IMPLANT
CLOSURE PERCLOSE PROSTYLE (VASCULAR PRODUCTS) IMPLANT
COVER PROBE ULTRASOUND 5X96 (MISCELLANEOUS) IMPLANT
GLIDEWIRE ADV .035X260CM (WIRE) IMPLANT
INTRODUCER PERFORM 12FR X13 (SHEATH) IMPLANT
NDL PERC 18GX7CM (NEEDLE) IMPLANT
NEEDLE PERC 18GX7CM (NEEDLE) ×1 IMPLANT
PACK ANGIOGRAPHY (CUSTOM PROCEDURE TRAY) ×1 IMPLANT
SHEATH BRITE TIP 6FRX11 (SHEATH) IMPLANT
SUT MNCRL AB 4-0 PS2 18 (SUTURE) IMPLANT
SUT PROLENE 0 CT 1 30 (SUTURE) IMPLANT
SYR MEDRAD MARK 7 150ML (SYRINGE) IMPLANT
TUBING CONTRAST HIGH PRESS 72 (TUBING) IMPLANT
WIRE J 3MM .035X145CM (WIRE) IMPLANT
WIRE SUPRACORE 300CM (WIRE) IMPLANT

## 2024-02-09 NOTE — H&P (Signed)
 NAME:  Steve Restivo., MRN:  [id number], DOB:  1941-06-05, LOS: 0 ADMISSION DATE:  02/08/2024, CONSULTATION DATE:  02/09/24 REFERRING MD:  Dr. Neomi, CHIEF COMPLAINT:  weakness/ shortness of breath   History of Present Illness:  83 yo M presenting to [facility] ED from home via EMS for evaluation of weakness and dyspnea.  History obtained per chart review and patient bedside report. Patient reports being in his normal state of health until Monday 8/18 when he began noticing a tightness in his chest with correlating and progressive weakness and dyspnea. He also endorses dizziness, headache and some mild blurred vision. He denies LOC or hitting his head. These symptoms would worsen with activity. This evening the patient went to sit down, missed his chair and landed on the floor. EMS reported SpO2 at 80% on room air and he was placed on a NRB 15 L for transport. He was also administered 324 mg of ASA & 600 mL bolus.  ED course: Upon arrival he was alert and oriented, tachypneic and tachycardic with marginal hemodynamics. Labs significant for elevated D-Dimer, hypokalemia, hyperglycemia, leukocytosis, elevated troponin and BNP. Imaging revealed large burden submassive PE with right heart strain and concerning for developing lung infarct. Vascular surgery was consulted urgently by EDP and will take the patient as their first case in the AM. Medications given: tylenol , Duo neb x 2, solu medrol , 20 meq K+, 1 L bolus, IV contrast  Initial Vitals: 98.1, 20, 104, 96/61 & 100% on 4 L [place] Significant labs: (Labs/ Imaging personally reviewed) I, Jenita Ruth Rust-Chester, AGACNP-BC, personally viewed and interpreted this ECG. EKG Interpretation: Date: 02/09/24, EKG Time: 01:21, Rate: 112, Rhythm: ST, QRS Axis:  RAD, Intervals: normal,, ST/T Wave abnormalities: inferior/ V3-V4 T wave inversions, Narrative Interpretation: ST with signs of ischemia (does not meet STEMI criteria Chemistry: Na+:141, K+: 3.1, BUN/Cr.: 20/  1.16, Serum CO2/ AG: 26/ 10, glucose: 181 Hematology: WBC: 13.2, Hgb: 14.0,  Troponin: 104, BNP: 505.6, Lactic: pending,  COVID-19 & Influenza A/B: negative D-Dimer: 19.7  VBG: 7.37/ 49/ <31/ 28.3 CXR 02/08/24: no active disease CT angio chest PE 02/09/24: Large volume of bilateral upper and lower lobe arterial embolic disease with right heart strain. CT evidence of right heart strain (RV/LV Ratio = 1.37) consistent with at least submassive (intermediate risk) PE. The presence of right heart strain has been associated with an increased risk of morbidity and mortality. 2. Right atrial thrombus, prominent pulmonary trunk, and linear thrombus in the right main pulmonary artery. 3. Near occlusive emboli in both upper and lower lobe main arteries and the interlobar pulmonary artery, near occlusive segmental emboli in the right middle lobe, and a combination of occlusive and near occlusive multifocal segmental and  subsegmental emboli in the upper and lower lobes. 4. New pleural-based opacity posteriorly in the right lower lobe which is probably a developing infarct. 5. 6 mm right lower lobe nodule. Non-contrast chest CT at 6-12 months is recommended. If the nodule is stable at time of repeat CT, then future CT at 18-24 months (from today's scan) is considered optional for low-risk patients, but is recommended for high-risk patients.Trace pleural effusions. 7. Aortic atherosclerosis.  PCCM consulted for admission due to large Acute Submassive PE with right heart strain and signs of ischemia.  Pertinent  Medical History  HTN HLD Grover's disease Mitral valve disorder Insomnia  Significant Hospital Events: Including procedures, antibiotic start and stop dates in addition to other pertinent events   02/09/24: Admit to the  ICU due to large Acute Submassive PE with right heart strain and signs of ischemia.  Interim History / Subjective:  Patient alert and responsive, vitals stable on 4 L [place]. Plan of  care discussed, all questions and concerns answered at this time.  Objective    Blood pressure 100/64, pulse (!) 119, temperature 98.1 F (36.7 C), temperature source Axillary, resp. rate (!) 23, height 6' 1 (1.854 m), weight 90.7 kg, SpO2 100%.       No intake or output data in the 24 hours ending 02/09/24 0156 Filed Weights   02/08/24 2338  Weight: 90.7 kg    Examination: General: Adult male, acutely ill, lying in bed, NAD HEENT: MM pink/moist, anicteric, atraumatic, neck supple Neuro: A&O x 4, able to follow commands, PERRL +3, MAE CV: s1s2 RRR, ST on monitor, no r/m/g Pulm: Regular, non labored on 4 L [place], breath sounds diminished throughout GI: soft, rounded, non tender, bs x 4 Skin: no rashes/lesions noted Extremities: warm/dry, pulses + 2 R/P, no edema noted Resolved problem list   Assessment and Plan  Acute Large Submassive Pulmonary embolism  Acute Hypoxic Respiratory Failure Right Heart Strain on CT Elevated Troponin suspect secondary to demand ischemia from PE Leukocytosis suspect reactive to above PMHx: PESI score 145 (very high risk) - Systemic Heparin  drip per pharmacy protocol - trend troponin to peak - f/u lactic - Supplemental O2 to maintain SpO2 > 90% - Follow up BLE dopplers to assess for DVT - Consider hypercoagulable panel (Factor V leiden, lupus anticoagulant, homocysteine, antithrombin III , protein C, protein S) - Echocardiogram ordered  - Vascular consulted, appreciate input if IVC filter/ thrombectomy is warranted  Hypokalemia - additional replacement ordered - Daily BMP, replace electrolytes PRN  Hyperlipidemia Hypertension - continue outpatient atorvastatin  - hold outpatient anti-hypertensive medications due to marginal hemodynamics, consider restarting once patient is stabilized  Steroid induced Hyperglycemia? Hemoglobin A1C: pending - Monitor CBG Q 4 hours - consider adding SSI PRN - target range while in ICU: 140-180 - follow ICU  hyper/hypo-glycemia protocol   Best Practice (right click and Reselect all SmartList Selections daily)  Diet/type: NPO w/ oral meds DVT prophylaxis systemic heparin  Pressure ulcer(s): N/A GI prophylaxis: PPI Lines: N/A Foley:  N/A Code Status:  full code Last date of multidisciplinary goals of care discussion [02/09/24]  Labs   CBC: Recent Labs  Lab 02/08/24 2356  WBC 13.2*  HGB 14.0  HCT 43.5  MCV 98.6  PLT 190    Basic Metabolic Panel: Recent Labs  Lab 02/08/24 2356  NA 141  K 3.1*  CL 105  CO2 26  GLUCOSE 181*  BUN 20  CREATININE 1.16  CALCIUM  8.4*   GFR: Estimated Creatinine Clearance: 55.5 mL/min (by C-G formula based on SCr of 1.16 mg/dL). Recent Labs  Lab 02/08/24 2356  WBC 13.2*    Liver Function Tests: No results for input(s): AST, ALT, ALKPHOS, BILITOT, PROT, ALBUMIN in the last 168 hours. No results for input(s): LIPASE, AMYLASE in the last 168 hours. No results for input(s): AMMONIA in the last 168 hours.  ABG    Component Value Date/Time   HCO3 28.3 (H) 02/08/2024 2356   O2SAT 22.9 02/08/2024 2356     Coagulation Profile: Recent Labs  Lab 02/08/24 2356  INR 1.2    Cardiac Enzymes: No results for input(s): CKTOTAL, CKMB, CKMBINDEX, TROPONINI in the last 168 hours.  HbA1C: No results found for: HGBA1C  CBG: No results for input(s): GLUCAP in the last 168 hours.  Review of Systems: positives in BOLD  Gen: Denies fever, chills, weight change, fatigue, night sweats HEENT: Denies blurred vision, double vision, hearing loss, tinnitus, sinus congestion, rhinorrhea, sore throat, neck stiffness, dysphagia PULM: Denies shortness of breath, cough, sputum production, hemoptysis, wheezing CV: Denies chest pain, edema, orthopnea, paroxysmal nocturnal dyspnea, palpitations GI: Denies abdominal pain, nausea, vomiting, diarrhea, hematochezia, melena, constipation, change in bowel habits GU: Denies dysuria,  hematuria, polyuria, oliguria, urethral discharge Endocrine: Denies hot or cold intolerance, polyuria, polyphagia or appetite change Derm: Denies rash, dry skin, scaling or peeling skin change Heme: Denies easy bruising, bleeding, bleeding gums Neuro: Denies headache, dizziness and mild blurred vision, numbness, weakness, slurred speech, loss of memory or consciousness  Past Medical History:  He,  has a past medical history of 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   Surgical History:   Past Surgical History:  Procedure Laterality Date   CARDIAC CATHETERIZATION Left 01/28/2016   Procedure: Left Heart Cath and Coronary Angiography;  Surgeon: Vinie DELENA Jude, MD;  Location: [facility] INVASIVE CV LAB;  Service: Cardiovascular;  Laterality: Left;   COLONOSCOPY N/A 01/31/2024   Procedure: COLONOSCOPY;  Surgeon: Onita Elspeth Sharper, DO;  Location: [facility] ENDOSCOPY;  Service: Gastroenterology;  Laterality: N/A;   COLONOSCOPY WITH PROPOFOL  N/A 05/10/2015   Procedure: COLONOSCOPY WITH PROPOFOL ;  Surgeon: Deward CINDERELLA Piedmont, MD;  Location: [facility] ENDOSCOPY;  Service: Gastroenterology;  Laterality: N/A;   ESOPHAGOGASTRODUODENOSCOPY N/A 01/31/2024   Procedure: EGD (ESOPHAGOGASTRODUODENOSCOPY);  Surgeon: Onita Elspeth Sharper, DO;  Location: [facility] ENDOSCOPY;  Service: Gastroenterology;  Laterality: N/A;   ESOPHAGOGASTRODUODENOSCOPY (EGD) WITH PROPOFOL  N/A 05/10/2015   Procedure: ESOPHAGOGASTRODUODENOSCOPY (EGD) WITH PROPOFOL ;  Surgeon: Deward CINDERELLA Piedmont, MD;  Location: [facility] ENDOSCOPY;  Service: Gastroenterology;  Laterality: N/A;   ESOPHAGOGASTRODUODENOSCOPY (EGD) WITH PROPOFOL  N/A 06/10/2015   Procedure: ESOPHAGOGASTRODUODENOSCOPY (EGD) WITH PROPOFOL ;  Surgeon: Deward CINDERELLA Piedmont, MD;  Location: [facility] ENDOSCOPY;  Service: Gastroenterology;  Laterality: N/A;   ESOPHAGOGASTRODUODENOSCOPY (EGD) WITH  PROPOFOL  N/A 06/29/2016   Procedure: ESOPHAGOGASTRODUODENOSCOPY (EGD) WITH PROPOFOL ;  Surgeon: Gladis RAYMOND Mariner, MD;  Location: [facility] ENDOSCOPY;  Service: Endoscopy;  Laterality: N/A;   SKIN CANCER EXCISION     on arms     Social History:   reports that he has never smoked. He has never used smokeless tobacco. He reports that he does not drink alcohol and does not use drugs.   Family History:  His family history is negative for Colon cancer.   Allergies Allergies  Allergen Reactions   Latex Hives and Rash   Sulfa Antibiotics Other (See Comments)    Reaction in childhood     Home Medications  Prior to Admission medications   Medication Sig Start Date End Date Taking? Authorizing Provider  atorvastatin  (LIPITOR) 10 MG tablet Take 10 mg by mouth daily.   Yes [provider]  lisinopril -hydrochlorothiazide  (ZESTORETIC ) 10-12.5 MG tablet Take 1 tablet by mouth daily.   Yes [provider]  omeprazole (PRILOSEC) 40 MG capsule Take 40 mg by mouth daily. 10/19/23  Yes [provider]  polyethylene glycol (MIRALAX ) 17 g packet Take 17 g by mouth daily. 01/05/24  Yes Clarine Sharper DELENA, MD  propranolol  (INDERAL ) 20 MG tablet Take 20 mg by mouth 2 (two) times daily. 11/18/23  Yes [provider]  valACYclovir (VALTREX) 500 MG tablet Take 500 mg by mouth daily as needed (FOR SHINGLES FLARE-UP).   Yes [provider]  folic acid (  FOLVITE) 1 MG tablet TAKE 3 TABLETS BY MOUTH ONCE DAILY ON  YOUR  NON  METHOTREXATE  DAYS 11/07/19   [provider]  glycerin  adult 2 g suppository Place 1 suppository rectally as needed for constipation. 01/05/24   Clarine Ozell LABOR, MD  methotrexate (RHEUMATREX) 2.5 MG tablet Take 10 mg by mouth once a week. 01/26/24   [provider]  zolpidem (AMBIEN) 10 MG tablet Take 10 mg by mouth at bedtime as needed for sleep.    [provider]     Critical care time: 68 minutes       Jenita Jama Meek,  AGACNP-BC Acute Care Nurse Practitioner [facility]   [phone] / [phone] Please see Amion for details.

## 2024-02-09 NOTE — Plan of Care (Signed)

## 2024-02-09 NOTE — Inpatient Diabetes Management (Signed)
 Inpatient Diabetes Program Recommendations  AACE/ADA: New Consensus Statement on Inpatient Glycemic Control (2015)  Target Ranges:  Prepandial:   less than 140 mg/dL      Peak postprandial:   less than 180 mg/dL (1-2 hours)      Critically ill patients:  140 - 180 mg/dL    Latest Reference Range & Units 02/09/24 03:04 02/09/24 08:34  Glucose-Capillary 70 - 99 mg/dL 763 (H) 798 (H)  (H): Data is abnormally high    Admit Acute Large Submassive Pulmonary embolism/ Acute Hypoxic Respiratory Failure/ Right Heart Strain on CT  No Hx of Diabetes  Current A1c Pending  Started CBG checks Q4H at 3am today    MD- Note CBGs >200  Please start Novolog  Sensitive Correction Scale/ SSI (0-9 units) Q4 hours    --Will follow patient during hospitalization--  Adina Rudolpho Arrow RN, MSN, CDCES Diabetes Coordinator Inpatient Glycemic Control Team Team Pager: [phone] (8a-5p)

## 2024-02-09 NOTE — Op Note (Signed)
 [facility]  Percutaneous Study/Intervention Procedural Note   Date of Surgery: 02/09/2024,8:23 AM  Surgeon: Selinda Gu  Pre-operative Diagnosis: Symptomatic bilateral pulmonary emboli  Post-operative diagnosis:  Same  Procedure(s) Performed:  1.  Contrast injection right heart  2.  Mechanical thrombectomy using the penumbra CAT 12 catheter to the left lower lobe and left upper lobe pulmonary arteries in the right lower lobe, middle lobe, and upper lobe pulmonary arteries  3.  Selective catheter placement left lower lobe and upper lobe pulmonary artery  4.  Selective catheter placement right lower lobe, middle lobe, and upper lobe pulmonary arteries      Anesthesia: Conscious sedation was administered under my direct supervision by the interventional radiology RN. IV Versed  plus fentanyl  were utilized. Continuous ECG, pulse oximetry and blood pressure was monitored throughout the entire procedure.  Versed  and fentanyl  were administered intravenously.  Conscious sedation was administered for a total of 31 minutes using 1.5 mg of Versed  and 50 mcg of Fentanyl .  EBL: 400 cc  Sheath: 14 French right femoral vein  Contrast: 50 cc   Fluoroscopy Time: 10.9 minutes  Indications:  Patient presents with pulmonary emboli. The patient is symptomatic with hypoxemia and dyspnea on exertion.  There is evidence of right heart strain on the CT angiogram. The patient is otherwise a good candidate for intervention and even the long-term benefits pulmonary angiography with thrombolysis is offered. The risks and benefits are reviewed long-term benefits are discussed. All questions are answered patient agrees to proceed.  Procedure:  MABLE LASHLEY Jr.is a 83 y.o. male who was identified and appropriate procedural time out was performed.  The patient was then placed supine on the table and prepped and draped in the usual sterile fashion.  Ultrasound was used to evaluate the right  common femoral vein.  It was patent, as it was echolucent and compressible.  A digital ultrasound image was acquired for the permanent record.  A Seldinger needle was used to access the right common femoral vein under direct ultrasound guidance.  A 0.035 J wire was advanced without resistance and a 6Fr sheath was placed. A proglide device was placed in a preclose fashion and then upsized to a 14 [place] sheath.    The Advantage wire and pigtail catheter were then negotiated into the right atrium and bolus injection of contrast was utilized to demonstrate the right ventricle and the pulmonary artery outflow. The Advantage wire and catheter were then negotiated into the the pulmonary arteries.  The left pulmonary artery was cannulated and advanced into the left upper lobe and left lower lobe for selective imaging. This demonstrated extensive thrombosis throughout the left main, upper, and lower lobe pulmonary arteries.  I then transitioned to the right side with the advantage wire and catheter and first cannulated the right lower lobe and then the right middle and upper lobes.  This demonstrated fairly extensive thrombosis throughout the right main, lower lobe, middle lobe, and upper lobe pulmonary arteries.  3000 Units of heparin  was then given and allowed to circulate.  The Penumbra Cat 12 catheter was then advanced up into the pulmonary vasculature. The left lung was addressed first. Catheter was negotiated into the left lower lobe pulmonary artery and mechanical thrombectomy was performed with the help of the separator. Follow-up imaging demonstrated a good result and therefore the catheter was renegotiated into the left upper lobe pulmonary artery and again mechanical thrombectomy was performed. Passes were made with both the Penumbra catheter itself  as well as introducing the separator. Follow-up imaging was then performed.  Significant improvement was seen at this point he had about 200 cc of blood loss so  further thrombectomy on the left side did not seem to be appropriate with the extensive thrombus on the right side.  The Penumbra Cat 12 catheter was then negotiated to the opposite side. The right lung was then addressed. Catheter was negotiated into the right lower lobe pulmonary artery and mechanical thrombectomy was performed with the separator. Follow-up imaging demonstrated a good result and therefore the catheter was renegotiated into the right middle lobe pulmonary artery and again mechanical thrombectomy was performed. Passes were made with both the Penumbra catheter itself as well as introducing the separator.  Finally, I was able to cannulate the right upper lobe pulmonary artery where mechanical thrombectomy was performed with the help of the separator.  Follow-up imaging was then performed.  Significant improvement was seen.  After review these images wires were reintroduced and the catheter is removed. Then, the sheath is then pulled, the proglide device is secured, a Monocryl skin suture was placed and pressure is held. A sterile dressing is placed.    Findings:   Right heart imaging:  Right atrium and right ventricle and the pulmonary outflow tract appears dilated  Right lung:  This demonstrated fairly extensive thrombosis throughout the right main, lower lobe, middle lobe, and upper lobe pulmonary arteries.  Left lung:  This demonstrated extensive thrombosis throughout the left main, upper, and lower lobe pulmonary arteries.    Disposition: Patient was taken to the recovery room in stable condition having tolerated the procedure well.  Aaisha Sliter 02/09/2024,8:23 AM

## 2024-02-09 NOTE — Progress Notes (Signed)
 ANTICOAGULATION CONSULT NOTE  Pharmacy Consult for heparin  infusion Indication: pulmonary embolus  Allergies  Allergen Reactions   Latex Hives and Rash   Sulfa Antibiotics Other (See Comments)    Reaction in childhood    Patient Measurements: Height: 6' 1 (185.4 cm) Weight: 96.8 kg (213 lb 6.5 oz) IBW/kg (Calculated) : 79.9 HEPARIN  DW (KG): 96.8  Vital Signs: Temp: 98.4 F (36.9 C) (08/20 1600) Temp Source: Oral (08/20 1600) BP: 126/72 (08/20 1800) Pulse Rate: 100 (08/20 1800)  Labs: Recent Labs    02/08/24 2356 02/09/24 0203 02/09/24 0358 02/09/24 0614 02/09/24 1118 02/09/24 1523 02/09/24 1745  HGB 14.0  --  14.2  --  13.1  --   --   HCT 43.5  --  44.2  --  41.3  --   --   PLT 190  --  189  --  194  --   --   APTT 31  --   --   --   --   --   --   LABPROT 16.2*  --   --   --   --   --   --   INR 1.2  --   --   --   --   --   --   HEPARINUNFRC  --   --   --   --   --   --  0.53  CREATININE 1.16  --  1.14  --  1.08  --   --   TROPONINIHS 104*   < > 125* 109* 202* 190*  --    < > = values in this interval not displayed.    Estimated Creatinine Clearance: 64.7 mL/min (by C-G formula based on SCr of 1.08 mg/dL).   Medical History: 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   Cancer [facility])    skin cancer   Colon polyp 04/2015   TUBULAR ADENOMA   GERD (gastroesophageal reflux disease)    Grover's disease    Hyperlipidemia    Hypertension    Mitral valve disorder     Assessment: Pt is a 83 yo male presenting to ED c/o weakness & SOB, found with O2 of 80% and large volume of bilateral upper and lower lobe arterial embolic disease with right heart strain.  Goal of Therapy:  Heparin  level 0.3-0.7 units/ml Monitor platelets by anticoagulation protocol: Yes  8/20 @ 1745: HL 0.53  Therapeutic x 1   Plan:  Continue heparin  infusion at 1450 units/hr Reheck HL in 8 hr CBC daily while on heparin   Will M. Lenon, PharmD Clinical  Pharmacist 02/09/2024 6:28 PM

## 2024-02-09 NOTE — Progress Notes (Signed)
 ANTICOAGULATION CONSULT NOTE  Pharmacy Consult for heparin  infusion Indication: pulmonary embolus  Allergies  Allergen Reactions   Latex Hives and Rash   Sulfa Antibiotics Other (See Comments)    Reaction in childhood    Patient Measurements: Height: 6' 1 (185.4 cm) Weight: 90.7 kg (200 lb) IBW/kg (Calculated) : 79.9 HEPARIN  DW (KG): 90.7  Vital Signs: Temp: 98.1 F (36.7 C) (08/19 2335) Temp Source: Axillary (08/19 2335) BP: 100/64 (08/20 0100) Pulse Rate: 119 (08/20 0100)  Labs: Recent Labs    02/08/24 2356  HGB 14.0  HCT 43.5  PLT 190  LABPROT 16.2*  INR 1.2  CREATININE 1.16  TROPONINIHS 104*    Estimated Creatinine Clearance: 55.5 mL/min (by C-G formula based on SCr of 1.16 mg/dL).   Medical History: 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   Cancer [facility])    skin cancer   Colon polyp 04/2015   TUBULAR ADENOMA   GERD (gastroesophageal reflux disease)    Grover's disease    Hyperlipidemia    Hypertension    Mitral valve disorder     Assessment: Pt is a 83 yo male presenting to ED c/o weakness & SOB, found with O2 of 80% and large volume of bilateral upper and lower lobe arterial embolic disease with right heart strain.  Goal of Therapy:  Heparin  level 0.3-0.7 units/ml Monitor platelets by anticoagulation protocol: Yes   Plan:  Bolus 5500 units x 1 Start heparin  infusion at 1450 units/hr Will check HL in 8 hr after start of infusion CBC daily while on heparin   Rankin CANDIE Dills, PharmD, [facility] 02/09/2024 1:57 AM

## 2024-02-09 NOTE — Plan of Care (Signed)

## 2024-02-09 NOTE — Telephone Encounter (Signed)
 Patient Product/process development scientist completed.    The patient is insured through [employer]. Patient has Medicare and is not eligible for a copay card, but may be able to apply for patient assistance or Medicare RX Payment Plan (Patient Must reach out to their plan, if eligible for payment plan), if available.    Ran test claim for Eliquis  5 mg and the current 30 day co-pay is $25.00.  Ran test claim for Xarelto 20 mg and the current 30 day co-pay is $25.00.  This test claim was processed through [facility]- copay amounts may vary at other pharmacies due to pharmacy/plan contracts, or as the patient moves through the different stages of their insurance plan.     Reyes Sharps, CPHT Pharmacy Technician III Certified Patient Advocate [facility] Patient Advocate Team Direct Number: [phone]  Fax: [phone]

## 2024-02-09 NOTE — Progress Notes (Signed)
 eLink Physician-Brief Progress Note Patient Name: Steve Rhodes. DOB: 09/20/1940 MRN: [id number]   Date of Service  02/09/2024  HPI/Events of Note  Patient admitted with acute pulmonary embolism.  eICU Interventions  New Patient Evaluation.        Ariza Evans U Sham Alviar 02/09/2024, 3:18 AM

## 2024-02-09 NOTE — Consult Note (Signed)
 Full consult to follow Submassive PE with right heart strain. Elevated troponin, lactate, BNP Plan pulmonary thrombectomy ASAP

## 2024-02-09 NOTE — Consult Note (Signed)
 [facility]IN [facility] Vascular Consult Note  MRN : [id number]  Steve Rhodes. is a 83 y.o. (11/20/1940) male who presents with chief complaint of  Chief Complaint  Patient presents with   Respiratory Distress  .  History of Present Illness: I am asked to see the patient by Dr. Neomi in the [facility] emergency department.  The patient presents with 2 to 3 days of feeling very poorly with shortness of breath, chest pain, and fatigue with lethargy.  No clear antecedent events.  He did have GI procedure last week.  No previous history of DVT or superficial thrombophlebitis to his knowledge.  He underwent a CT angiogram.  I have independently reviewed his CT angiogram and he has a very large volume pulmonary embolus with evidence of right heart strain.  He has pulmonary embolus bilaterally and in all 5 primary lobar arteries.  With the findings on CT as well as laboratory abnormalities, tachycardia, and hypoxia requiring oxygen we are consulted for consideration for pulmonary thrombectomy.  Current Facility-Administered Medications  Medication Dose Route Frequency Provider Last Rate Last Admin   acetaminophen  (TYLENOL ) tablet 650 mg  650 mg Oral Q4H PRN Rust-Chester, Britton L, NP       Chlorhexidine  Gluconate Cloth 2 % PADS 6 each  6 each Topical Daily Rust-Chester, Britton L, NP       docusate sodium  (COLACE) capsule 100 mg  100 mg Oral BID PRN Rust-Chester, Jenita CROME, NP       heparin  ADULT infusion 100 units/mL (25000 units/250mL)  1,450 Units/hr Intravenous Continuous Dail Rankin RAMAN, RPH 14.5 mL/hr at 02/09/24 0439 1,450 Units/hr at 02/09/24 0439   pantoprazole  (PROTONIX ) EC tablet 80 mg  80 mg Oral Daily Rust-Chester, Britton L, NP       polyethylene glycol (MIRALAX  / GLYCOLAX ) packet 17 g  17 g Oral Daily PRN Rust-Chester, Jenita CROME, NP        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   Cancer (HCC)    skin cancer   Colon polyp 04/2015    TUBULAR ADENOMA   GERD (gastroesophageal reflux disease)    Grover's disease    Hyperlipidemia    Hypertension    Mitral valve disorder     Past Surgical History:  Procedure Laterality Date   CARDIAC CATHETERIZATION Left 01/28/2016   Procedure: Left Heart Cath and Coronary Angiography;  Surgeon: Vinie DELENA Jude, MD;  Location: [facility] INVASIVE CV LAB;  Service: Cardiovascular;  Laterality: Left;   COLONOSCOPY N/A 01/31/2024   Procedure: COLONOSCOPY;  Surgeon: Onita Elspeth Sharper, DO;  Location: [facility] ENDOSCOPY;  Service: Gastroenterology;  Laterality: N/A;   COLONOSCOPY WITH PROPOFOL  N/A 05/10/2015   Procedure: COLONOSCOPY WITH PROPOFOL ;  Surgeon: Deward CINDERELLA Piedmont, MD;  Location: [facility] ENDOSCOPY;  Service: Gastroenterology;  Laterality: N/A;   ESOPHAGOGASTRODUODENOSCOPY N/A 01/31/2024   Procedure: EGD (ESOPHAGOGASTRODUODENOSCOPY);  Surgeon: Onita Elspeth Sharper, DO;  Location: [facility] ENDOSCOPY;  Service: Gastroenterology;  Laterality: N/A;   ESOPHAGOGASTRODUODENOSCOPY (EGD) WITH PROPOFOL  N/A 05/10/2015   Procedure: ESOPHAGOGASTRODUODENOSCOPY (EGD) WITH PROPOFOL ;  Surgeon: Deward CINDERELLA Piedmont, MD;  Location: [facility] ENDOSCOPY;  Service: Gastroenterology;  Laterality: N/A;   ESOPHAGOGASTRODUODENOSCOPY (EGD) WITH PROPOFOL  N/A 06/10/2015   Procedure: ESOPHAGOGASTRODUODENOSCOPY (EGD) WITH PROPOFOL ;  Surgeon: Deward CINDERELLA Piedmont, MD;  Location: [facility] ENDOSCOPY;  Service: Gastroenterology;  Laterality: N/A;   ESOPHAGOGASTRODUODENOSCOPY (EGD) WITH PROPOFOL  N/A 06/29/2016   Procedure: ESOPHAGOGASTRODUODENOSCOPY (EGD) WITH PROPOFOL ;  Surgeon: Gladis RAYMOND Mariner, MD;  Location: [facility] ENDOSCOPY;  Service: Endoscopy;  Laterality: N/A;   SKIN CANCER EXCISION     on arms     Social History   Tobacco Use   Smoking status: Never   Smokeless tobacco: Never  Vaping Use   Vaping status: Never Used  Substance Use Topics   Alcohol use: No   Drug use: No     Family History  Problem Relation Age of Onset   Colon cancer Neg Hx   No  bleeding or clotting disorders No aneurysms  Allergies  Allergen Reactions   Latex Hives and Rash   Sulfa Antibiotics Other (See Comments)    Reaction in childhood     REVIEW OF SYSTEMS (Negative unless checked)  Constitutional: [] Weight loss  [] Fever  [] Chills Cardiac: [x] Chest pain   [] Chest pressure   [] Palpitations   [] Shortness of breath when laying flat   [x] Shortness of breath at rest   [x] Shortness of breath with exertion. Vascular:  [] Pain in legs with walking   [] Pain in legs at rest   [] Pain in legs when laying flat   [] Claudication   [] Pain in feet when walking  [] Pain in feet at rest  [] Pain in feet when laying flat   [] History of DVT   [] Phlebitis   [] Swelling in legs   [] Varicose veins   [] Non-healing ulcers Pulmonary:   [] Uses home oxygen   [] Productive cough   [] Hemoptysis   [] Wheeze  [] COPD   [] Asthma Neurologic:  [] Dizziness  [] Blackouts   [] Seizures   [] History of stroke   [] History of TIA  [] Aphasia   [] Temporary blindness   [] Dysphagia   [] Weakness or numbness in arms   [] Weakness or numbness in legs Musculoskeletal:  [x] Arthritis   [] Joint swelling   [] Joint pain   [] Low back pain Hematologic:  [] Easy bruising  [] Easy bleeding   [] Hypercoagulable state   [] Anemic  [] Hepatitis Gastrointestinal:  [] Blood in stool   [] Vomiting blood  [x] Gastroesophageal reflux/heartburn   [] Difficulty swallowing. Genitourinary:  [] Chronic kidney disease   [] Difficult urination  [] Frequent urination  [] Burning with urination   [] Blood in urine Skin:  [] Rashes   [] Ulcers   [] Wounds Psychological:  [] History of anxiety   []  History of major depression.  Physical Examination  Vitals:   02/09/24 0315 02/09/24 0400 02/09/24 0500 02/09/24 0600  BP: 102/68 97/80 104/76 110/83  Pulse: (!) 105 (!) 103 99 (!) 101  Resp: (!) 23  (!) 27 14  Temp: 98.4 F (36.9 C)     TempSrc: Oral     SpO2: 100% 96% 100% 100%  Weight: 96.8 kg     Height: 6' 1 (1.854 m)      Body mass index is 28.16  kg/m. Gen:  WD/WN, NAD Head: [place]/AT, No temporalis wasting.  Ear/Nose/Throat: Hearing grossly intact, nares w/o erythema or drainage, oropharynx w/o Erythema/Exudate Eyes: Sclera non-icteric, conjunctiva clear Neck: Trachea midline.  No JVD.  Pulmonary:  Good air movement, respirations not labored, equal bilaterally.  Cardiac: tachycardic Vascular:  Vessel Right Left  Radial Palpable Palpable   Musculoskeletal: M/S 5/5 throughout.  Extremities without ischemic changes.  No deformity or atrophy. No edema. Neurologic: Sensation grossly intact in extremities.  Symmetrical.  Speech is fluent. Motor exam as listed above. Psychiatric: Judgment intact, Mood & affect appropriate for pt's clinical situation. Dermatologic: No rashes or ulcers noted.  No cellulitis or open wounds. Lymph : No Cervical, Axillary, or Inguinal lymphadenopathy.     CBC Lab Results  Component Value Date   WBC 9.5  02/09/2024   HGB 14.2 02/09/2024   HCT 44.2 02/09/2024   MCV 98.4 02/09/2024   PLT 189 02/09/2024    BMET    Component Value Date/Time   NA 138 02/09/2024 0358   K 4.2 02/09/2024 0358   CL 105 02/09/2024 0358   CO2 23 02/09/2024 0358   GLUCOSE 245 (H) 02/09/2024 0358   BUN 20 02/09/2024 0358   CREATININE 1.14 02/09/2024 0358   CALCIUM  8.7 (L) 02/09/2024 0358   GFRNONAA >60 02/09/2024 0358   GFRAA >60 01/20/2020 1604   Estimated Creatinine Clearance: 61.3 mL/min (by C-G formula based on SCr of 1.14 mg/dL).  COAG Lab Results  Component Value Date   INR 1.2 02/08/2024    Radiology CT Angio Chest PE W and/or Wo Contrast Addendum Date: 02/09/2024 ADDENDUM REPORT: 02/09/2024 05:08 ADDENDUM: Critical Value/emergent results were called by telephone at the time of interpretation on 02/09/2024 at 1:42 am to provider [facility] , who verbally acknowledged these results. Electronically Signed   By: Francis Quam M.D.   On: 02/09/2024 05:08   Result Date: 02/09/2024 CLINICAL DATA:  Positive  D-dimer, weakness, and shortness of breath. Hypoxia on EMS arrival. EXAM: CT ANGIOGRAPHY CHEST WITH CONTRAST TECHNIQUE: Multidetector CT imaging of the chest was performed using the standard protocol during bolus administration of intravenous contrast. Multiplanar CT image reconstructions and MIPs were obtained to evaluate the vascular anatomy. RADIATION DOSE REDUCTION: This exam was performed according to the departmental dose-optimization program which includes automated exposure control, adjustment of the mA and/or kV according to patient size and/or use of iterative reconstruction technique. CONTRAST:  75mL OMNIPAQUE  IOHEXOL  350 MG/ML SOLN COMPARISON:  CT abdomen pelvis with IV contrast 01/05/2024, portable chest today, PA Lat chest 10/04/2009. No prior chest CT or CTA. FINDINGS: Cardiovascular: Large volume of bilateral upper and lower lobe arterial embolic disease with right heart strain. There is mild cardiomegaly with a right chamber predominance, IVC and hepatic vein reflux, and bowing of the interventricular septum to the left, all in keeping with right heart strain. There is a band of thrombus occupying the right atrium, a prominent pulmonary trunk measuring 3.2 cm, linear thrombus in the right main pulmonary artery, and nearly occlusive thrombus in both distal main pulmonary arteries. There are near occlusive emboli in both upper and lower lobe main arteries and the interlobar pulmonary artery, near occlusive segmental emboli in the right middle lobe, and a combination of occlusive and near occlusive multifocal segmental and subsegmental emboli in the upper and lower lobes. There is a small pericardial effusion new from 01/05/2024. the pulmonary veins are normal caliber. The aorta is tortuous with trace calcific plaques in the descending segment. There is no aneurysm or dissection. The great vessels are unremarkable. Mediastinum/Nodes: No enlarged mediastinal, hilar, or axillary lymph nodes. Thyroid  gland, trachea, and esophagus demonstrate no significant findings. Lungs/Pleura: There is a new pleural-based opacity posteriorly in the right lower lobe which is probably a developing infarct. There is a 6 mm right lower lobe nodule in the superior segment on 5:76 which is above the plane of all prior abdomen and pelvis CTs. Remaining bilateral lungs are essentially clear. There are trace pleural effusions. There is chronic linear scarring in the base of the lingula and base of the right middle lobe. Upper Abdomen: No acute abnormality. Abdominal aortic atherosclerosis. Musculoskeletal: There is degenerative change and kyphosis of the thoracic spine, slight dextroscoliosis. No acute or significant osseous abnormality. Unremarkable chest wall. Review of the MIP images  confirms the above findings. IMPRESSION: 1. Large volume of bilateral upper and lower lobe arterial embolic disease with right heart strain. CT evidence of right heart strain (RV/LV Ratio = 1.37) consistent with at least submassive (intermediate risk) PE. The presence of right heart strain has been associated with an increased risk of morbidity and mortality. 2. Right atrial thrombus, prominent pulmonary trunk, and linear thrombus in the right main pulmonary artery. 3. Near occlusive emboli in both upper and lower lobe main arteries and the interlobar pulmonary artery, near occlusive segmental emboli in the right middle lobe, and a combination of occlusive and near occlusive multifocal segmental and subsegmental emboli in the upper and lower lobes. 4. New pleural-based opacity posteriorly in the right lower lobe which is probably a developing infarct. 5. 6 mm right lower lobe nodule. Non-contrast chest CT at 6-12 months is recommended. If the nodule is stable at time of repeat CT, then future CT at 18-24 months (from today's scan) is considered optional for low-risk patients, but is recommended for high-risk patients. This recommendation follows the  consensus statement: Guidelines for Management of Incidental Pulmonary Nodules Detected on CT Images: From the Fleischner Society 2017; Radiology 2017; 284:228-243. 6. Trace pleural effusions. 7. Aortic atherosclerosis. 8. PRA is attempting to reach the ordering physician for stat notification at the time of signing. Aortic Atherosclerosis (ICD10-I70.0). Electronically Signed: By: Francis Quam M.D. On: 02/09/2024 01:36   US  Venous Img Lower Bilateral (DVT) Result Date: 02/09/2024 CLINICAL DATA:  Large volume bilateral pulmonary embolic burden. Assess for DVT. [id number]. EXAM: BILATERAL LOWER EXTREMITY VENOUS DOPPLER ULTRASOUND TECHNIQUE: Gray-scale sonography with graded compression, as well as color Doppler and duplex ultrasound were performed to evaluate the lower extremity deep venous systems from the level of the common femoral vein and including the common femoral, femoral, profunda femoral, popliteal and calf veins including the posterior tibial, peroneal and gastrocnemius veins when visible. The superficial great saphenous vein was also interrogated. Spectral Doppler was utilized to evaluate flow at rest and with distal augmentation maneuvers in the common femoral, femoral and popliteal veins. COMPARISON:  None Available. FINDINGS: RIGHT LOWER EXTREMITY Common Femoral Vein: No evidence of thrombus. Normal compressibility, respiratory phasicity and response to augmentation. Saphenofemoral Junction: No evidence of thrombus. Normal compressibility and flow on color Doppler imaging. Profunda Femoral Vein: No evidence of thrombus. Normal compressibility and flow on color Doppler imaging. Femoral Vein: No evidence of thrombus. Normal compressibility, respiratory phasicity and response to augmentation. Popliteal Vein: No evidence of thrombus. Normal compressibility, respiratory phasicity and response to augmentation. Calf Veins: No evidence of thrombus. Normal compressibility and flow on color Doppler imaging.  Superficial Great Saphenous Vein: No evidence of thrombus. Normal compressibility. Venous Reflux:  None. Other Findings:  None. LEFT LOWER EXTREMITY Common Femoral Vein: No evidence of thrombus. Normal compressibility, respiratory phasicity and response to augmentation. Saphenofemoral Junction: No evidence of thrombus. Normal compressibility and flow on color Doppler imaging. Profunda Femoral Vein: No evidence of thrombus. Normal compressibility and flow on color Doppler imaging. Femoral Vein: No evidence of thrombus. Normal compressibility, respiratory phasicity and response to augmentation. Popliteal Vein: No evidence of thrombus. Normal compressibility, respiratory phasicity and response to augmentation. Calf Veins: No evidence of thrombus. Normal compressibility and flow on color Doppler imaging. Superficial Great Saphenous Vein: No evidence of thrombus. Normal compressibility. Venous Reflux:  None. Other Findings:  None. IMPRESSION: No evidence of deep venous thrombosis in either lower extremity. Electronically Signed   By: Francis Quam M.D.   On:  02/09/2024 05:07   DG Chest 1 View Result Date: 02/09/2024 CLINICAL DATA:  Shortness of breath and fall EXAM: CHEST  1 VIEW COMPARISON:  10/04/2009 FINDINGS: Stable cardiomediastinal silhouette. Bibasilar atelectasis. The lungs are otherwise clear. No pleural effusion or pneumothorax. No displaced rib fracture. IMPRESSION: No active disease. Electronically Signed   By: Norman Gatlin M.D.   On: 02/09/2024 00:05      Assessment/Plan 1.  Submassive pulmonary embolus with right heart strain.  I have independently reviewed his CT angiogram and he has a very large volume pulmonary embolus with evidence of right heart strain.  He has pulmonary embolus bilaterally and in all 5 primary lobar arteries.  He has elevated troponin, BNP, and lactate consistent with endorgan damage.  He has been initiated on anticoagulation.  His oxygen saturations have held on  supplemental nasal cannula oxygen at 4 L.  He remains mildly tachycardic.  I would recommend proceeding with pulmonary thrombectomy this morning.  Risks and benefits of the procedure were discussed with the patient and the family in detail and they are agreeable to proceed.  Continue anticoagulation and we will get him on the schedule first case this morning. 2.  Hypertension.  Stable on outpatient medications currently on the lower side due to the pulmonary embolus.  Can hold his outpatient medications until after the procedure when his blood pressure will hopefully come up some. 3.  Hyperlipidemia. lipid control important in reducing the progression of atherosclerotic disease. Continue statin therapy    Selinda Gu, MD  02/09/2024 6:44 AM    This note was created with Dragon medical transcription system.  Any error is purely unintentional

## 2024-02-10 DIAGNOSIS — Z9889 Other specified postprocedural states: Secondary | ICD-10-CM

## 2024-02-10 DIAGNOSIS — I2699 Other pulmonary embolism without acute cor pulmonale: Secondary | ICD-10-CM | POA: Diagnosis not present

## 2024-02-10 DIAGNOSIS — I2781 Cor pulmonale (chronic): Secondary | ICD-10-CM

## 2024-02-10 DIAGNOSIS — Z7901 Long term (current) use of anticoagulants: Secondary | ICD-10-CM | POA: Diagnosis not present

## 2024-02-10 DIAGNOSIS — I2609 Other pulmonary embolism with acute cor pulmonale: Secondary | ICD-10-CM | POA: Diagnosis not present

## 2024-02-10 LAB — CBC
HCT: 36.9 % — ABNORMAL LOW (ref 39.0–52.0)
Hemoglobin: 11.8 g/dL — ABNORMAL LOW (ref 13.0–17.0)
MCH: 31.6 pg (ref 26.0–34.0)
MCHC: 32 g/dL (ref 30.0–36.0)
MCV: 98.7 fL (ref 80.0–100.0)
Platelets: 158 K/uL (ref 150–400)
RBC: 3.74 MIL/uL — ABNORMAL LOW (ref 4.22–5.81)
RDW: 13.4 % (ref 11.5–15.5)
WBC: 14 K/uL — ABNORMAL HIGH (ref 4.0–10.5)
nRBC: 0 % (ref 0.0–0.2)

## 2024-02-10 LAB — PHOSPHORUS: Phosphorus: 2.8 mg/dL (ref 2.5–4.6)

## 2024-02-10 LAB — BLOOD GAS, VENOUS
Bicarbonate: 28.3 mmol/L — ABNORMAL HIGH (ref 20.0–28.0)
O2 Saturation: 22.9 mmol/L — AB (ref 0.0–2.0)
Patient temperature: 37
Patient temperature: 37
pCO2, Ven: 49 mmHg (ref 44–60)
pH, Ven: 7.37 (ref 7.25–7.43)
pO2, Ven: <31 mmol/L — CL (ref 32–45)

## 2024-02-10 LAB — HOMOCYSTEINE: Homocysteine: 14.9 umol/L (ref 0.0–21.3)

## 2024-02-10 LAB — HEPARIN LEVEL (UNFRACTIONATED): Heparin Unfractionated: 0.51 [IU]/mL (ref 0.30–0.70)

## 2024-02-10 LAB — BETA-2-GLYCOPROTEIN I ABS, IGG/M/A
Beta-2 Glyco I IgG: <9 GPI IgG units (ref 0–20)
Beta-2-Glycoprotein I IgA: <9 GPI IgA units (ref 0–25)
Beta-2-Glycoprotein I IgM: <9 GPI IgM units (ref 0–32)

## 2024-02-10 LAB — BASIC METABOLIC PANEL WITH GFR
Anion gap: 7 (ref 5–15)
BUN: 27 mg/dL — ABNORMAL HIGH (ref 8–23)
CO2: 22 mmol/L (ref 22–32)
Calcium: 8.4 mg/dL — ABNORMAL LOW (ref 8.9–10.3)
Chloride: 109 mmol/L (ref 98–111)
Creatinine, Ser: 1.19 mg/dL (ref 0.61–1.24)
GFR, Estimated: >60 mL/min (ref >60)
Glucose, Bld: 123 mg/dL — ABNORMAL HIGH (ref 70–99)
Potassium: 4.1 mmol/L (ref 3.5–5.1)
Sodium: 138 mmol/L (ref 135–145)

## 2024-02-10 LAB — GLUCOSE, CAPILLARY
Glucose-Capillary: 103 mg/dL — ABNORMAL HIGH (ref 70–99)
Glucose-Capillary: 109 mg/dL — ABNORMAL HIGH (ref 70–99)
Glucose-Capillary: 118 mg/dL — ABNORMAL HIGH (ref 70–99)
Glucose-Capillary: 118 mg/dL — ABNORMAL HIGH (ref 70–99)
Glucose-Capillary: 125 mg/dL — ABNORMAL HIGH (ref 70–99)
Glucose-Capillary: 142 mg/dL — ABNORMAL HIGH (ref 70–99)

## 2024-02-10 LAB — MAGNESIUM: Magnesium: 2.2 mg/dL (ref 1.7–2.4)

## 2024-02-10 MED ORDER — APIXABAN 5 MG PO TABS: 5.0000 mg | ORAL_TABLET | Freq: Two times a day (BID) | ORAL | Status: DC

## 2024-02-10 MED ORDER — APIXABAN 5 MG PO TABS
10.0000 mg | ORAL_TABLET | Freq: Two times a day (BID) | ORAL | Status: DC
  Administered 2024-02-10 – 2024-02-12 (×5): 10 mg via ORAL
  Filled 2024-02-10 (×5): qty 2

## 2024-02-10 MED ORDER — PROPRANOLOL HCL 20 MG PO TABS
20.0000 mg | ORAL_TABLET | Freq: Two times a day (BID) | ORAL | Status: DC
  Administered 2024-02-10 – 2024-02-12 (×4): 20 mg via ORAL
  Filled 2024-02-10 (×4): qty 1

## 2024-02-10 MED ORDER — ATORVASTATIN CALCIUM 10 MG PO TABS
10.0000 mg | ORAL_TABLET | Freq: Every day | ORAL | Status: DC
  Administered 2024-02-10 – 2024-02-12 (×3): 10 mg via ORAL
  Filled 2024-02-10 (×3): qty 1

## 2024-02-10 NOTE — Discharge Instructions (Signed)
 Vascular surgery discharge instructions:  Do not lift anything heavy for the next 2 weeks.  Do not lift anything more than a gallon of milk.  We request you do not drive for 1 week and do not drive if you are taking the narcotic medication at home.  You may shower tomorrow on 02/11/2024.  Shower with the old dressing to your groin in place and remove immediately after showering.  Pat the area completely dry and place a Band-Aid over the puncture site.  Change the Band-Aid once daily for the next 3 days.  You are being discharged on Eliquis  10 mg twice daily for 7 days.  The dose will then decrease to 5 mg twice daily indefinitely.  Please do not miss or skip taking this medication as it will alter the outcome of your procedure.  Follow-up with vein and vascular surgery as scheduled.

## 2024-02-10 NOTE — Progress Notes (Signed)
 PROGRESS NOTE    Steve Rhodes.  FMW:[id number] DOB: February 09, 1941 DOA: 02/08/2024 PCP: [name], MD  Chief Complaint  Patient presents with   Respiratory Distress    Hospital Course:  83 yo M with PMH HTN, HLD presenting to [facility] ED from home via EMS for evaluation of weakness and dyspnea.  Workup revealed large submassive PE, seen by vascular surgery underwent mechanical thrombectomy.  Was started on IV heparin  and now transition to p.o. Eliquis .  Patient reports having chest discomfort and mild pain. PT eval pending, anticipate discharge tomorrow if stable Hospital course as below   Subjective: Patient was examined at the bedside, new to me today.  Daughter present at the bedside. Patient reports having mild chest discomfort and pain.  Also has anxiety of how he would feel after going home. PT eval, anticipate discharge tomorrow if stable   Objective: Vitals:   02/10/24 0715 02/10/24 0725 02/10/24 0800 02/10/24 0900  BP: 115/70 (!) 129/56 (!) 140/79 117/75  Pulse: 77 80 80 92  Resp: (!) 24 17 20  (!) 24  Temp:  97.9 F (36.6 C)    TempSrc:  Oral    SpO2: 95% 97% 96% 97%  Weight:      Height:        Intake/Output Summary (Last 24 hours) at 02/10/2024 1006 Last data filed at 02/10/2024 1005 Gross per 24 hour  Intake 541.23 ml  Output 1351 ml  Net -809.77 ml   Filed Weights   02/08/24 2338 02/09/24 0315  Weight: 90.7 kg 96.8 kg    Examination: General: no acute distress, resting in bed HEENT: /AT, moist mucous membranes, sclera anicteric Neuro: A&O x 3, moving all extremities CV: rrr, s1s2, no murmurs PULM: clear to auscultation bilaterally. No wheezing GI: soft, non-tender, non-distended, BS+ Extremities: warm, no edema  Assessment & Plan:  Principal Problem:   Acute pulmonary embolism with acute cor pulmonale, unspecified pulmonary embolism type (HCC) Active Problems:   Cor pulmonale (HCC)   Acute Large Submassive Pulmonary embolism with cor  pulmonale s/p mechanical thrombectomy - Denies prior history of VTE, no family history, denies smoking, recent surgery, prolonged travel, immobilization - Elevated Troponin suspect secondary to demand ischemia from PE - Leukocytosis suspect reactive to above - US  b/l neg for DVT   - Echo EF 60-65%, no RWMA, G1DD. IVS flattened consistent with right ventricular pressure and volume overload. Mild to mod TR - was on IV heparin  drip, transitioned to po Eliquis  - Follow up with Vascular surgery outpatient, appreciate inpatient recs - hypercoagulable panel sent - PT/OT eval pending  Acute Hypoxic Respiratory Failure - resolved - due to above, now on RA  Hyperlipidemia Hypertension - continue outpatient atorvastatin  - hold outpatient anti-hypertensive medications due to marginal hemodynamics, consider restarting once patient is stabilized  DVT prophylaxis: Eliquis    Code Status: Full Code Disposition:  Home  Consultants:    Procedures:  S/p Mechanical thrombectomy  Antimicrobials:  Anti-infectives (From admission, onward)    Start     Dose/Rate Route Frequency Ordered Stop   02/09/24 0830  ceFAZolin  (ANCEF ) IVPB 2g/100 mL premix        2 g 200 mL/hr over 30 Minutes Intravenous  Once 02/09/24 0729 02/09/24 0800       Data Reviewed: I have personally reviewed following labs and imaging studies CBC: Recent Labs  Lab 02/08/24 2356 02/09/24 0358 02/09/24 1118 02/10/24 0208  WBC 13.2* 9.5 7.7 14.0*  NEUTROABS  --   --  6.3  --   HGB 14.0 14.2 13.1 11.8*  HCT 43.5 44.2 41.3 36.9*  MCV 98.6 98.4 97.4 98.7  PLT 190 189 194 158   Basic Metabolic Panel: Recent Labs  Lab 02/08/24 2356 02/09/24 0358 02/09/24 1118 02/10/24 0435  NA 141 138 139 138  K 3.1* 4.2 4.4 4.1  CL 105 105 109 109  CO2 26 23 20* 22  GLUCOSE 181* 245* 204* 123*  BUN 20 20 21  27*  CREATININE 1.16 1.14 1.08 1.19  CALCIUM  8.4* 8.7* 8.4* 8.4*  MG  --  2.3  --  2.2  PHOS  --  2.6  --  2.8    GFR: Estimated Creatinine Clearance: 58.7 mL/min (by C-G formula based on SCr of 1.19 mg/dL). Liver Function Tests: Recent Labs  Lab 02/09/24 1118  AST 26  ALT 24  ALKPHOS 50  BILITOT 0.5  PROT 6.2*  ALBUMIN 2.9*   CBG: Recent Labs  Lab 02/09/24 1650 02/09/24 1949 02/09/24 2314 02/10/24 0338 02/10/24 0718  GLUCAP 156* 154* 120* 125* 109*    Recent Results (from the past 240 hours)  Resp panel by RT-PCR (RSV, Flu A&B, Covid) Anterior Nasal Swab     Status: None   Collection Time: 02/08/24 11:56 PM   Specimen: Anterior Nasal Swab  Result Value Ref Range Status   SARS Coronavirus 2 by RT PCR NEGATIVE NEGATIVE Final    Comment: (NOTE) SARS-CoV-2 target nucleic acids are NOT DETECTED.  The SARS-CoV-2 RNA is generally detectable in upper respiratory specimens during the acute phase of infection. The lowest concentration of SARS-CoV-2 viral copies this assay can detect is 138 copies/mL. A negative result does not preclude SARS-Cov-2 infection and should not be used as the sole basis for treatment or other patient management decisions. A negative result may occur with  improper specimen collection/handling, submission of specimen other than nasopharyngeal swab, presence of viral mutation(s) within the areas targeted by this assay, and inadequate number of viral copies(<138 copies/mL). A negative result must be combined with clinical observations, patient history, and epidemiological information. The expected result is Negative.  Fact Sheet for Patients:  BloggerCourse.com  Fact Sheet for Healthcare Providers:  SeriousBroker.it  This test is no t yet approved or cleared by the [place]  FDA and  has been authorized for detection and/or diagnosis of SARS-CoV-2 by FDA under an Emergency Use Authorization (EUA). This EUA will remain  in effect (meaning this test can be used) for the duration of the COVID-19  declaration under Section 564(b)(1) of the Act, 21 U.S.C.section 360bbb-3(b)(1), unless the authorization is terminated  or revoked sooner.       Influenza A by PCR NEGATIVE NEGATIVE Final   Influenza B by PCR NEGATIVE NEGATIVE Final    Comment: (NOTE) The Xpert Xpress SARS-CoV-2/FLU/RSV plus assay is intended as an aid in the diagnosis of influenza from Nasopharyngeal swab specimens and should not be used as a sole basis for treatment. Nasal washings and aspirates are unacceptable for Xpert Xpress SARS-CoV-2/FLU/RSV testing.  Fact Sheet for Patients: BloggerCourse.com  Fact Sheet for Healthcare Providers: SeriousBroker.it  This test is not yet approved or cleared by the [place]  FDA and has been authorized for detection and/or diagnosis of SARS-CoV-2 by FDA under an Emergency Use Authorization (EUA). This EUA will remain in effect (meaning this test can be used) for the duration of the COVID-19 declaration under Section 564(b)(1) of the Act, 21 U.S.C. section 360bbb-3(b)(1), unless the authorization is terminated or revoked.  Resp Syncytial Virus by PCR NEGATIVE NEGATIVE Final    Comment: (NOTE) Fact Sheet for Patients: BloggerCourse.com  Fact Sheet for Healthcare Providers: SeriousBroker.it  This test is not yet approved or cleared by the [place]  FDA and has been authorized for detection and/or diagnosis of SARS-CoV-2 by FDA under an Emergency Use Authorization (EUA). This EUA will remain in effect (meaning this test can be used) for the duration of the COVID-19 declaration under Section 564(b)(1) of the Act, 21 U.S.C. section 360bbb-3(b)(1), unless the authorization is terminated or revoked.  Performed at [facility], [address] [place], [place] [address]   MRSA Next Gen by PCR, Nasal     Status: None   Collection Time: 02/09/24  2:30 AM    Specimen: Nasal Mucosa; Nasal Swab  Result Value Ref Range Status   MRSA by PCR Next Gen NOT DETECTED NOT DETECTED Final    Comment: (NOTE) The GeneXpert MRSA Assay (FDA approved for NASAL specimens only), is one component of a comprehensive MRSA colonization surveillance program. It is not intended to diagnose MRSA infection nor to guide or monitor treatment for MRSA infections. Test performance is not FDA approved in patients less than 36 years old. Performed at [facility], [address] [place], [place] [address]      Radiology Studies: ECHOCARDIOGRAM COMPLETE Result Date: 02/09/2024    ECHOCARDIOGRAM REPORT   Patient Name:   Halo Laski. Date of Exam: 02/09/2024 Medical Rec #:  [id number]        Height:       73.0 in Accession #:    [id number]       Weight:       213.4 lb Date of Birth:  1940-12-18        BSA:          2.212 m Patient Age:    82 years         BP:           100/77 mmHg Patient Gender: M                HR:           92 bpm. Exam Location:  [facility] Procedure: 2D Echo, Color Doppler, Cardiac Doppler and Intracardiac            Opacification Agent (Both Spectral and Color Flow Doppler were            utilized during procedure). Indications:     Pulmonary Embolus I26.09  History:         Patient has no prior history of Echocardiogram examinations.                  Pulmonary Embolism.  Sonographer:     Ashley McNeely-Sloane Referring Phys:  [id number] SELINDA RAMAN DEW Diagnosing Phys: Evalene Lunger MD IMPRESSIONS  1. Left ventricular ejection fraction, by estimation, is 60 to 65%. The left ventricle has normal function. The left ventricle has no regional wall motion abnormalities. There is mild left ventricular hypertrophy. Left ventricular diastolic parameters are consistent with Grade I diastolic dysfunction (impaired relaxation). There is the interventricular septum is flattened in systole and diastole, consistent with right ventricular pressure and volume overload.  2. Right  ventricular systolic function is moderately reduced. The right ventricular size is severely enlarged. There is moderately elevated pulmonary artery systolic pressure. The estimated right ventricular systolic pressure is 48.6 mmHg.  3. The mitral valve is normal in structure. Mild mitral  valve regurgitation. No evidence of mitral stenosis.  4. Tricuspid valve regurgitation is mild to moderate.  5. The aortic valve is normal in structure. Aortic valve regurgitation is not visualized. No aortic stenosis is present.  6. The inferior vena cava is normal in size with greater than 50% respiratory variability, suggesting right atrial pressure of 3 mmHg. FINDINGS  Left Ventricle: Left ventricular ejection fraction, by estimation, is 60 to 65%. The left ventricle has normal function. The left ventricle has no regional wall motion abnormalities. Definity  contrast agent was given IV to delineate the left ventricular  endocardial borders. Strain was performed and the global longitudinal strain is indeterminate. The left ventricular internal cavity size was normal in size. There is mild left ventricular hypertrophy. The interventricular septum is flattened in systole and diastole, consistent with right ventricular pressure and volume overload. Left ventricular diastolic parameters are consistent with Grade I diastolic dysfunction (impaired relaxation). Right Ventricle: The right ventricular size is severely enlarged. No increase in right ventricular wall thickness. Right ventricular systolic function is moderately reduced. There is moderately elevated pulmonary artery systolic pressure. The tricuspid regurgitant velocity is 3.30 m/s, and with an assumed right atrial pressure of 5 mmHg, the estimated right ventricular systolic pressure is 48.6 mmHg. Left Atrium: Left atrial size was normal in size. Right Atrium: Right atrial size was normal in size. Pericardium: There is no evidence of pericardial effusion. Mitral Valve: The  mitral valve is normal in structure. Mild mitral valve regurgitation. No evidence of mitral valve stenosis. Tricuspid Valve: The tricuspid valve is normal in structure. Tricuspid valve regurgitation is mild to moderate. No evidence of tricuspid stenosis. Aortic Valve: The aortic valve is normal in structure. Aortic valve regurgitation is not visualized. No aortic stenosis is present. Aortic valve mean gradient measures 2.0 mmHg. Aortic valve peak gradient measures 3.1 mmHg. Aortic valve area, by VTI measures 2.62 cm. Pulmonic Valve: The pulmonic valve was normal in structure. Pulmonic valve regurgitation is not visualized. No evidence of pulmonic stenosis. Aorta: The aortic root is normal in size and structure. Venous: The inferior vena cava is normal in size with greater than 50% respiratory variability, suggesting right atrial pressure of 3 mmHg. IAS/Shunts: No atrial level shunt detected by color flow Doppler. Additional Comments: 3D was performed not requiring image post processing on an independent workstation and was indeterminate.  LEFT VENTRICLE PLAX 2D LVIDd:         4.10 cm     Diastology LVIDs:         2.30 cm     LV e' medial:    5.55 cm/s LV PW:         1.50 cm     LV E/e' medial:  7.7 LV IVS:        0.90 cm     LV e' lateral:   8.81 cm/s LVOT diam:     1.90 cm     LV E/e' lateral: 4.9 LV SV:         38 LV SV Index:   17 LVOT Area:     2.84 cm  LV Volumes (MOD) LV vol d, MOD A2C: 39.6 ml LV vol d, MOD A4C: 20.5 ml LV vol s, MOD A2C: 10.0 ml LV vol s, MOD A4C: 9.7 ml LV SV MOD A2C:     29.6 ml LV SV MOD A4C:     20.5 ml LV SV MOD BP:      19.3 ml RIGHT VENTRICLE RV Basal diam:  5.10 cm RV Mid diam:    4.30 cm RV S prime:     6.42 cm/s TAPSE (M-mode): 0.8 cm LEFT ATRIUM             Index        RIGHT ATRIUM           Index LA diam:        2.60 cm 1.18 cm/m   RA Area:     13.50 cm LA Vol (A2C):   60.4 ml 27.31 ml/m  RA Volume:   31.40 ml  14.20 ml/m LA Vol (A4C):   16.6 ml 7.51 ml/m LA Biplane Vol:  34.3 ml 15.51 ml/m  AORTIC VALVE AV Area (Vmax):    2.83 cm AV Area (Vmean):   2.62 cm AV Area (VTI):     2.62 cm AV Vmax:           87.80 cm/s AV Vmean:          60.100 cm/s AV VTI:            0.145 m AV Peak Grad:      3.1 mmHg AV Mean Grad:      2.0 mmHg LVOT Vmax:         87.50 cm/s LVOT Vmean:        55.500 cm/s LVOT VTI:          0.134 m LVOT/AV VTI ratio: 0.92  AORTA Ao Root diam: 3.50 cm Ao Asc diam:  3.50 cm MITRAL VALVE               TRICUSPID VALVE MV Area (PHT): 4.49 cm    TR Peak grad:   43.6 mmHg MV Decel Time: 169 msec    TR Mean grad:   26.0 mmHg MV E velocity: 43.00 cm/s  TR Vmax:        330.00 cm/s MV A velocity: 91.70 cm/s  TR Vmean:       237.0 cm/s MV E/A ratio:  0.47                            SHUNTS                            Systemic VTI:  0.13 m                            Systemic Diam: 1.90 cm Evalene Lunger MD Electronically signed by Evalene Lunger MD Signature Date/Time: 02/09/2024/3:41:49 PM    Final    DG Chest Port 1 View Result Date: 02/09/2024 CLINICAL DATA:  [id number] Acute hypoxic respiratory failure (HCC) [id number] EXAM: PORTABLE CHEST - 1 VIEW COMPARISON:  February 08, 2024 FINDINGS: No focal airspace consolidation, large pleural effusion, or pneumothorax. Mild cardiomegaly. Tortuous aorta with aortic atherosclerosis. No acute fracture or destructive lesions. Multilevel thoracic osteophytosis. IMPRESSION: Cardiomegaly.  No pneumonia or pulmonary edema. Electronically Signed   By: Rogelia Myers M.D.   On: 02/09/2024 10:57   PERIPHERAL VASCULAR CATHETERIZATION Result Date: 02/09/2024 See surgical note for result.  CT Angio Chest PE W and/or Wo Contrast Addendum Date: 02/09/2024 ADDENDUM REPORT: 02/09/2024 05:08 ADDENDUM: Critical Value/emergent results were called by telephone at the time of interpretation on 02/09/2024 at 1:42 am to provider [facility] , who verbally acknowledged these results. Electronically Signed   By: Francis Beatriz HERO.D.  On: 02/09/2024 05:08    Result Date: 02/09/2024 CLINICAL DATA:  Positive D-dimer, weakness, and shortness of breath. Hypoxia on EMS arrival. EXAM: CT ANGIOGRAPHY CHEST WITH CONTRAST TECHNIQUE: Multidetector CT imaging of the chest was performed using the standard protocol during bolus administration of intravenous contrast. Multiplanar CT image reconstructions and MIPs were obtained to evaluate the vascular anatomy. RADIATION DOSE REDUCTION: This exam was performed according to the departmental dose-optimization program which includes automated exposure control, adjustment of the mA and/or kV according to patient size and/or use of iterative reconstruction technique. CONTRAST:  75mL OMNIPAQUE  IOHEXOL  350 MG/ML SOLN COMPARISON:  CT abdomen pelvis with IV contrast 01/05/2024, portable chest today, PA Lat chest 10/04/2009. No prior chest CT or CTA. FINDINGS: Cardiovascular: Large volume of bilateral upper and lower lobe arterial embolic disease with right heart strain. There is mild cardiomegaly with a right chamber predominance, IVC and hepatic vein reflux, and bowing of the interventricular septum to the left, all in keeping with right heart strain. There is a band of thrombus occupying the right atrium, a prominent pulmonary trunk measuring 3.2 cm, linear thrombus in the right main pulmonary artery, and nearly occlusive thrombus in both distal main pulmonary arteries. There are near occlusive emboli in both upper and lower lobe main arteries and the interlobar pulmonary artery, near occlusive segmental emboli in the right middle lobe, and a combination of occlusive and near occlusive multifocal segmental and subsegmental emboli in the upper and lower lobes. There is a small pericardial effusion new from 01/05/2024. the pulmonary veins are normal caliber. The aorta is tortuous with trace calcific plaques in the descending segment. There is no aneurysm or dissection. The great vessels are unremarkable. Mediastinum/Nodes: No enlarged  mediastinal, hilar, or axillary lymph nodes. Thyroid gland, trachea, and esophagus demonstrate no significant findings. Lungs/Pleura: There is a new pleural-based opacity posteriorly in the right lower lobe which is probably a developing infarct. There is a 6 mm right lower lobe nodule in the superior segment on 5:76 which is above the plane of all prior abdomen and pelvis CTs. Remaining bilateral lungs are essentially clear. There are trace pleural effusions. There is chronic linear scarring in the base of the lingula and base of the right middle lobe. Upper Abdomen: No acute abnormality. Abdominal aortic atherosclerosis. Musculoskeletal: There is degenerative change and kyphosis of the thoracic spine, slight dextroscoliosis. No acute or significant osseous abnormality. Unremarkable chest wall. Review of the MIP images confirms the above findings. IMPRESSION: 1. Large volume of bilateral upper and lower lobe arterial embolic disease with right heart strain. CT evidence of right heart strain (RV/LV Ratio = 1.37) consistent with at least submassive (intermediate risk) PE. The presence of right heart strain has been associated with an increased risk of morbidity and mortality. 2. Right atrial thrombus, prominent pulmonary trunk, and linear thrombus in the right main pulmonary artery. 3. Near occlusive emboli in both upper and lower lobe main arteries and the interlobar pulmonary artery, near occlusive segmental emboli in the right middle lobe, and a combination of occlusive and near occlusive multifocal segmental and subsegmental emboli in the upper and lower lobes. 4. New pleural-based opacity posteriorly in the right lower lobe which is probably a developing infarct. 5. 6 mm right lower lobe nodule. Non-contrast chest CT at 6-12 months is recommended. If the nodule is stable at time of repeat CT, then future CT at 18-24 months (from today's scan) is considered optional for low-risk patients, but is recommended for  high-risk patients.  This recommendation follows the consensus statement: Guidelines for Management of Incidental Pulmonary Nodules Detected on CT Images: From the Fleischner Society 2017; Radiology 2017; 284:228-243. 6. Trace pleural effusions. 7. Aortic atherosclerosis. 8. PRA is attempting to reach the ordering physician for stat notification at the time of signing. Aortic Atherosclerosis (ICD10-I70.0). Electronically Signed: By: Francis Quam M.D. On: 02/09/2024 01:36   US  Venous Img Lower Bilateral (DVT) Result Date: 02/09/2024 CLINICAL DATA:  Large volume bilateral pulmonary embolic burden. Assess for DVT. [id number]. EXAM: BILATERAL LOWER EXTREMITY VENOUS DOPPLER ULTRASOUND TECHNIQUE: Gray-scale sonography with graded compression, as well as color Doppler and duplex ultrasound were performed to evaluate the lower extremity deep venous systems from the level of the common femoral vein and including the common femoral, femoral, profunda femoral, popliteal and calf veins including the posterior tibial, peroneal and gastrocnemius veins when visible. The superficial great saphenous vein was also interrogated. Spectral Doppler was utilized to evaluate flow at rest and with distal augmentation maneuvers in the common femoral, femoral and popliteal veins. COMPARISON:  None Available. FINDINGS: RIGHT LOWER EXTREMITY Common Femoral Vein: No evidence of thrombus. Normal compressibility, respiratory phasicity and response to augmentation. Saphenofemoral Junction: No evidence of thrombus. Normal compressibility and flow on color Doppler imaging. Profunda Femoral Vein: No evidence of thrombus. Normal compressibility and flow on color Doppler imaging. Femoral Vein: No evidence of thrombus. Normal compressibility, respiratory phasicity and response to augmentation. Popliteal Vein: No evidence of thrombus. Normal compressibility, respiratory phasicity and response to augmentation. Calf Veins: No evidence of thrombus. Normal  compressibility and flow on color Doppler imaging. Superficial Great Saphenous Vein: No evidence of thrombus. Normal compressibility. Venous Reflux:  None. Other Findings:  None. LEFT LOWER EXTREMITY Common Femoral Vein: No evidence of thrombus. Normal compressibility, respiratory phasicity and response to augmentation. Saphenofemoral Junction: No evidence of thrombus. Normal compressibility and flow on color Doppler imaging. Profunda Femoral Vein: No evidence of thrombus. Normal compressibility and flow on color Doppler imaging. Femoral Vein: No evidence of thrombus. Normal compressibility, respiratory phasicity and response to augmentation. Popliteal Vein: No evidence of thrombus. Normal compressibility, respiratory phasicity and response to augmentation. Calf Veins: No evidence of thrombus. Normal compressibility and flow on color Doppler imaging. Superficial Great Saphenous Vein: No evidence of thrombus. Normal compressibility. Venous Reflux:  None. Other Findings:  None. IMPRESSION: No evidence of deep venous thrombosis in either lower extremity. Electronically Signed   By: Francis Quam M.D.   On: 02/09/2024 05:07   DG Chest 1 View Result Date: 02/09/2024 CLINICAL DATA:  Shortness of breath and fall EXAM: CHEST  1 VIEW COMPARISON:  10/04/2009 FINDINGS: Stable cardiomediastinal silhouette. Bibasilar atelectasis. The lungs are otherwise clear. No pleural effusion or pneumothorax. No displaced rib fracture. IMPRESSION: No active disease. Electronically Signed   By: Norman Gatlin M.D.   On: 02/09/2024 00:05    Scheduled Meds:  Chlorhexidine  Gluconate Cloth  6 each Topical Daily   insulin  aspart  1-3 Units Subcutaneous Q4H   pantoprazole   40 mg Oral Daily   Continuous Infusions:  heparin  1,450 Units/hr (02/10/24 0731)     LOS: 1 day  MDM: Patient is high risk for one or more organ failure.  They necessitate ongoing hospitalization for continued IV therapies and subsequent lab monitoring. Total  time spent interpreting labs and vitals, reviewing the medical record, coordinating care amongst consultants and care team members, directly assessing and discussing care with the patient and/or family: 55 min  [name], MD [facility]  To contact the  attending physician between 7A-7P please use Epic Chat. To contact the covering physician during after hours 7P-7A, please review Amion.  02/10/2024, 10:06 AM   *This document has been created with the assistance of dictation software. Please excuse typographical errors. *

## 2024-02-10 NOTE — Progress Notes (Signed)
  Progress Note    02/10/2024 8:16 AM 1 Day Post-Op  Subjective:  Steve Rhodes. Is an 83 yo male that is now POD #1 from:  Procedure(s) Performed:             1.  Contrast injection right heart             2.  Mechanical thrombectomy using the penumbra CAT 12 catheter to the left lower lobe and left upper lobe pulmonary arteries in the right lower lobe, middle lobe, and upper lobe pulmonary arteries             3.  Selective catheter placement left lower lobe and upper lobe pulmonary artery             4.  Selective catheter placement right lower lobe, middle lobe, and upper lobe pulmonary arteries  Patient resting comfortably this morning.  No respiratory distress.  Patient on room air oxygen.  Patient endorses he is feeling much better today.  Vitals:   02/10/24 0715 02/10/24 0725  BP: 115/70 (!) 129/56  Pulse: 77 80  Resp: (!) 24 17  Temp:  97.9 F (36.6 C)  SpO2: 95% 97%   Physical Exam: Cardiac:  RRR, normal S1 and S2.  No murmurs. Lungs: Lungs rhonchorous on auscultation.  Nonproductive cough.  Nonlabored breathing this morning. Incisions: Right groin puncture site with dressing clean dry and intact. Extremities: All extremities warm to touch with palpable pulses. Abdomen: Positive bowel sounds throughout, soft, nondistended nontender. Neurologic: Alert and oriented x 3, answers all questions follows commands appropriately.  CBC    Component Value Date/Time   WBC 14.0 (H) 02/10/2024 0208   RBC 3.74 (L) 02/10/2024 0208   HGB 11.8 (L) 02/10/2024 0208   HCT 36.9 (L) 02/10/2024 0208   PLT 158 02/10/2024 0208   MCV 98.7 02/10/2024 0208   MCH 31.6 02/10/2024 0208   MCHC 32.0 02/10/2024 0208   RDW 13.4 02/10/2024 0208   LYMPHSABS 1.1 02/09/2024 1118   MONOABS 0.3 02/09/2024 1118   EOSABS 0.0 02/09/2024 1118   BASOSABS 0.0 02/09/2024 1118    BMET    Component Value Date/Time   NA 138 02/10/2024 0435   K 4.1 02/10/2024 0435   CL 109 02/10/2024 0435   CO2 22  02/10/2024 0435   GLUCOSE 123 (H) 02/10/2024 0435   BUN 27 (H) 02/10/2024 0435   CREATININE 1.19 02/10/2024 0435   CALCIUM  8.4 (L) 02/10/2024 0435   GFRNONAA >60 02/10/2024 0435   GFRAA >60 01/20/2020 1604    INR    Component Value Date/Time   INR 1.2 02/08/2024 2356     Intake/Output Summary (Last 24 hours) at 02/10/2024 0816 Last data filed at 02/10/2024 0701 Gross per 24 hour  Intake 420.56 ml  Output 1701 ml  Net -1280.44 ml     Assessment/Plan:  83 y.o. male is s/p SEE ABOVE 1 Day Post-Op   PLAN  Discontinue heparin  infusion. Start patient on Eliquis  10 mg twice daily for 7 days then decrease dose to 5 mg twice daily definitely. Advance diet as tolerated. PT/OT Vascular surgery okay with patient discharge once he has been converted to oral anticoagulation and is medically stable.   Steve Rhodes [facility] 02/10/2024 8:16 AM

## 2024-02-10 NOTE — Progress Notes (Signed)
 The patient has been transferred to room 157, floor 1A. Report was given to nurse Candis ORN, RN.

## 2024-02-10 NOTE — Progress Notes (Signed)
  Vein and Vascular Surgery  Daily Progress Note   Subjective  -   Patient resting comfortably this morning.  Says he feels much better after pulmonary thrombectomy yesterday.  Breathing better.  No significant pain.  Objective Vitals:   02/10/24 0725 02/10/24 0800 02/10/24 0900 02/10/24 1000  BP: (!) 129/56 (!) 140/79 117/75 121/81  Pulse: 80 80 92 87  Resp: 17 20 (!) 24   Temp: 97.9 F (36.6 C)     TempSrc: Oral     SpO2: 97% 96% 97% 96%  Weight:      Height:        Intake/Output Summary (Last 24 hours) at 02/10/2024 1129 Last data filed at 02/10/2024 1005 Gross per 24 hour  Intake 526.73 ml  Output 1351 ml  Net -824.27 ml    PULM  CTAB CV  RRR VASC  access site is clean, dry, and intact  Laboratory CBC    Component Value Date/Time   WBC 14.0 (H) 02/10/2024 0208   HGB 11.8 (L) 02/10/2024 0208   HCT 36.9 (L) 02/10/2024 0208   PLT 158 02/10/2024 0208    BMET    Component Value Date/Time   NA 138 02/10/2024 0435   K 4.1 02/10/2024 0435   CL 109 02/10/2024 0435   CO2 22 02/10/2024 0435   GLUCOSE 123 (H) 02/10/2024 0435   BUN 27 (H) 02/10/2024 0435   CREATININE 1.19 02/10/2024 0435   CALCIUM  8.4 (L) 02/10/2024 0435   GFRNONAA >60 02/10/2024 0435   GFRAA >60 01/20/2020 1604    Assessment/Planning: POD #1 s/p bilateral pulmonary thrombectomy  Clinically markedly improved after pulmonary thrombectomy yesterday Okay to transition to oral anticoagulation and moved to the floor with potential discharge in the next 24 hours Would recommend anticoagulation for at least 1 year going forward Can follow-up in our office in 1 month.   Selinda Gu  02/10/2024, 11:29 AM

## 2024-02-10 NOTE — Progress Notes (Signed)
 ANTICOAGULATION CONSULT NOTE  Pharmacy Consult for heparin  infusion Indication: pulmonary embolus  Allergies  Allergen Reactions   Latex Hives and Rash   Sulfa Antibiotics Other (See Comments)    Reaction in childhood    Patient Measurements: Height: 6' 1 (185.4 cm) Weight: 96.8 kg (213 lb 6.5 oz) IBW/kg (Calculated) : 79.9 HEPARIN  DW (KG): 96.8  Vital Signs: Temp: 97.9 F (36.6 C) (08/20 2000) Temp Source: Oral (08/20 2000) BP: 109/67 (08/21 0200) Pulse Rate: 83 (08/21 0200)  Labs: Recent Labs    02/08/24 2356 02/09/24 0203 02/09/24 0358 02/09/24 0614 02/09/24 1118 02/09/24 1523 02/09/24 1745 02/10/24 0208  HGB 14.0  --  14.2  --  13.1  --   --  11.8*  HCT 43.5  --  44.2  --  41.3  --   --  36.9*  PLT 190  --  189  --  194  --   --  158  APTT 31  --   --   --   --   --   --   --   LABPROT 16.2*  --   --   --   --   --   --   --   INR 1.2  --   --   --   --   --   --   --   HEPARINUNFRC  --   --   --   --   --   --  0.53 0.51  CREATININE 1.16  --  1.14  --  1.08  --   --   --   TROPONINIHS 104*   < > 125* 109* 202* 190*  --   --    < > = values in this interval not displayed.    Estimated Creatinine Clearance: 64.7 mL/min (by C-G formula based on SCr of 1.08 mg/dL).   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  Goal of Therapy:  Heparin  level 0.3-0.7 units/ml Monitor platelets by anticoagulation protocol: Yes  8/20 @ 1745: HL 0.53 , therapeutic x 1 8/21 0208 HL 0.51, therapeutic x 1   Plan:  Continue heparin  infusion at  1450 units/hr Recheck HL daily w/ AM labs while therapeutic CBC daily while on heparin   Rankin CANDIE Dills, PharmD, MBA 02/10/2024 3:00 AM

## 2024-02-10 NOTE — Plan of Care (Signed)

## 2024-02-11 LAB — PROTEIN S ACTIVITY: Protein S Activity: 97 % (ref 63–140)

## 2024-02-11 LAB — CBC
HCT: 34.7 % — ABNORMAL LOW (ref 39.0–52.0)
Hemoglobin: 11.4 g/dL — ABNORMAL LOW (ref 13.0–17.0)
MCH: 31.5 pg (ref 26.0–34.0)
MCHC: 32.9 g/dL (ref 30.0–36.0)
MCV: 95.9 fL (ref 80.0–100.0)
Platelets: 183 K/uL (ref 150–400)
RBC: 3.62 MIL/uL — ABNORMAL LOW (ref 4.22–5.81)
RDW: 13.6 % (ref 11.5–15.5)
WBC: 9.2 K/uL (ref 4.0–10.5)
nRBC: 0 % (ref 0.0–0.2)

## 2024-02-11 LAB — CARDIOLIPIN ANTIBODIES, IGG, IGM, IGA
Anticardiolipin IgA: <9 U/mL (ref 0–11)
Anticardiolipin IgG: <9 GPL U/mL (ref 0–14)
Anticardiolipin IgM: <9 [MPL'U]/mL (ref 0–12)

## 2024-02-11 LAB — GLUCOSE, CAPILLARY
Glucose-Capillary: 104 mg/dL — ABNORMAL HIGH (ref 70–99)
Glucose-Capillary: 105 mg/dL — ABNORMAL HIGH (ref 70–99)
Glucose-Capillary: 113 mg/dL — ABNORMAL HIGH (ref 70–99)
Glucose-Capillary: 129 mg/dL — ABNORMAL HIGH (ref 70–99)
Glucose-Capillary: 130 mg/dL — ABNORMAL HIGH (ref 70–99)
Glucose-Capillary: 99 mg/dL (ref 70–99)

## 2024-02-11 LAB — BASIC METABOLIC PANEL WITH GFR
Anion gap: 8 (ref 5–15)
BUN: 23 mg/dL (ref 8–23)
CO2: 24 mmol/L (ref 22–32)
Calcium: 8.1 mg/dL — ABNORMAL LOW (ref 8.9–10.3)
Chloride: 106 mmol/L (ref 98–111)
Creatinine, Ser: 1.14 mg/dL (ref 0.61–1.24)
GFR, Estimated: >60 mL/min (ref >60)
Glucose, Bld: 113 mg/dL — ABNORMAL HIGH (ref 70–99)
Potassium: 4.3 mmol/L (ref 3.5–5.1)
Sodium: 138 mmol/L (ref 135–145)

## 2024-02-11 LAB — PROTEIN C ACTIVITY: Protein C Activity: 63 % — ABNORMAL LOW (ref 73–180)

## 2024-02-11 LAB — ANTITHROMBIN III: AntiThromb III Func: 73 % — ABNORMAL LOW (ref 75–120)

## 2024-02-11 LAB — PROTEIN S, TOTAL: Protein S Ag, Total: 108 % (ref 60–150)

## 2024-02-11 MED ORDER — APIXABAN 5 MG PO TABS: ORAL_TABLET | ORAL | 0 refills | Status: DC

## 2024-02-11 NOTE — Plan of Care (Signed)

## 2024-02-11 NOTE — Evaluation (Signed)
 Occupational Therapy Evaluation Patient Details Name: Steve Rhodes. MRN: [id number] DOB: [birth year] Today's Date: 02/11/2024   History of Present Illness   83 yo M with PMH HTN, HLD presenting to [facility] ED from home via EMS for evaluation of weakness and dyspnea.  Workup revealed large submassive PE, seen by vascular surgery underwent mechanical thrombectomy on 02/09/24.     Clinical Impressions Pt was seen for OT evaluation this date. Prior to hospital admission, pt was active and independent until a few recent medical concerns. 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 Pt presents with deficits in cardiopulmonary status, balance, and activity tolerance affecting safe and optimal ADL completion. Pt currently requires increased time/effort to complete bed mobility, CGA for standing with RW, MIN A for donning shoes EOB, and requires increased time between positional changes to support breath recovery. SpO2 on RA 94% or better. Left with PT for additional assessment/mobility. Pt/son educated in home/routines modifications for gradual return to prior routines, use of pulse oximeter at home to monitor vitals. Pt/son verbalized understanding. Pt would benefit from skilled OT services to address noted impairments and functional limitations (see below for any additional details) in order to maximize safety and independence while minimizing future risk of falls, injury, and readmission. Do not anticipate the need for follow up OT services upon acute hospital DC. May benefit from cardiopulmonary rehab if appropriate.     If plan is discharge home, recommend the following:   A little help with walking and/or transfers;A little help with bathing/dressing/bathroom;Assistance with cooking/housework;Assist for transportation;Help with stairs or ramp for entrance     Functional Status Assessment   Patient has had a recent decline in their functional status and demonstrates the  ability to make significant improvements in function in a reasonable and predictable amount of time.     Equipment Recommendations   Other (comment) (RW)     Recommendations for Other Services         Precautions/Restrictions   Precautions Precautions: Fall Precaution/Restrictions Comments: monitor SpO2/HR Restrictions Weight Bearing Restrictions Per Provider Order: No     Mobility Bed Mobility Overal bed mobility: Modified Independent             General bed mobility comments: increased effort/time    Transfers Overall transfer level: Needs assistance Equipment used: Rolling walker (2 wheels) Transfers: Sit to/from Stand Sit to Stand: Contact guard assist           General transfer comment: increased effort, bracing BLE against bed initially      Balance Overall balance assessment: Needs assistance Sitting-balance support: No upper extremity supported, Feet supported Sitting balance-Leahy Scale: Good     Standing balance support: Single extremity supported, Bilateral upper extremity supported Standing balance-Leahy Scale: Fair Standing balance comment: pt endorses feeling too unsteady without the RW                           ADL either performed or assessed with clinical judgement   ADL Overall ADL's : Needs assistance/impaired                     Lower Body Dressing: Sitting/lateral leans;Sit to/from stand;Minimal assistance Lower Body Dressing Details (indicate cue type and reason): MIN A for donning shoes, discomfort with bending Toilet Transfer: Contact guard assist           Functional mobility during ADLs: Contact guard assist;Rolling walker (  2 wheels)       Vision         Perception         Praxis         Pertinent Vitals/Pain Pain Assessment Pain Assessment: No/denies pain (mild discomfort with bending at groin incision)     Extremity/Trunk Assessment Upper Extremity Assessment Upper Extremity  Assessment: Overall WFL for tasks assessed   Lower Extremity Assessment Lower Extremity Assessment: Defer to PT evaluation       Communication Communication Communication: No apparent difficulties   Cognition Arousal: Alert Behavior During Therapy: WFL for tasks assessed/performed Cognition: No apparent impairments             OT - Cognition Comments: mildly anxious                 Following commands: Intact       Cueing  General Comments      SpO2 96% on RA sitting EOB, 94% on RA after marching in place briefly, HR up to 80   Exercises Other Exercises Other Exercises: Pt/son educated in home/routines modifications for gradual return to prior routines, use of pulse oximeter at home to monitor vitals   Shoulder Instructions      Home Living Family/patient expects to be discharged to:: Private residence Living Arrangements: Spouse/significant other Available Help at Discharge: Family;Available 24 hours/day Type of Home: House Home Access: Stairs to enter [employer] of Steps: 1+1   Home Layout: One level     Bathroom Shower/Tub: Producer, television/film/video: Handicapped height     Home Equipment: Medical laboratory scientific officer - single point;BSC/3in1;Shower seat - built in;Hand held shower head   Additional Comments: BSC was his wife's      Prior Functioning/Environment Prior Level of Function : Independent/Modified Independent;Driving             Mobility Comments: SPC outside (lives on farm) ADLs Comments: cares for farm with lots of animals, indep    OT Problem List: Cardiopulmonary status limiting activity;Decreased activity tolerance;Impaired balance (sitting and/or standing);Decreased knowledge of use of DME or AE   OT Treatment/Interventions: Self-care/ADL training;Therapeutic exercise;Therapeutic activities;Energy conservation;DME and/or AE instruction;Patient/family education;Balance training      OT Goals(Current goals can be found in  the care plan section)   Acute Rehab OT Goals Patient Stated Goal: get better and go home safely OT Goal Formulation: With patient/family Time For Goal Achievement: 02/25/24 Potential to Achieve Goals: Good ADL Goals Pt Will Perform Lower Body Dressing: with modified independence Pt Will Transfer to Toilet: with modified independence Pt Will Perform Toileting - Clothing Manipulation and hygiene: with modified independence Additional ADL Goal #1: Pt will verbalize plan to implement at least 2 learned ECS into daily ADL/IADL routines to promote safety and minimize SOB.   OT Frequency:  Min 2X/week    Co-evaluation              AM-PAC OT 6 Clicks Daily Activity     Outcome Measure Help from another person eating meals?: None Help from another person taking care of personal grooming?: A Little Help from another person toileting, which includes using toliet, bedpan, or urinal?: A Little Help from another person bathing (including washing, rinsing, drying)?: A Little Help from another person to put on and taking off regular upper body clothing?: None Help from another person to put on and taking off regular lower body clothing?: A Little 6 Click Score: 20   End of Session Equipment Utilized During Treatment:  Rolling walker (2 wheels);Gait belt  Activity Tolerance: Patient tolerated treatment well Patient left: Other (comment) (standing in room with RW with PT)  OT Visit Diagnosis: Unsteadiness on feet (R26.81)                Time: 8973-8954 OT Time Calculation (min): 19 min Charges:  OT General Charges $OT Visit: 1 Visit OT Evaluation $OT Eval Low Complexity: 1 Low OT Treatments $Self Care/Home Management : 8-22 mins  Warren SAUNDERS., MPH, MS, OTR/L ascom [phone] 02/11/24, 11:07 AM

## 2024-02-11 NOTE — Discharge Summary (Signed)
 Physician Discharge Summary   Patient: Steve Rhodes. MRN: [id number] DOB: 12-09-1940  Admit date:     02/08/2024  Discharge date: 02/11/24  Discharge Physician: Steve Rhodes   PCP: Steve Leita POUR, MD   Recommendations at discharge:   Follow up with PCP in 1 week - Repeat CBC Monitor BP and resume antihypertensives as needed  Follow up with Vascular surgery outpatient Follow up with Hematology outpatient - referral provided  Discharge Diagnoses: Principal Problem:   Acute pulmonary embolism with acute cor pulmonale (HCC) Active Problems:   Cor pulmonale (HCC)  Resolved Problems:   * No resolved hospital problems. *  Hospital Course: 83 yo M with PMH HTN, HLD presenting to [facility] ED from home via EMS for evaluation of weakness and dyspnea.  Workup revealed large submassive PE, seen by vascular surgery underwent mechanical thrombectomy.  Was started on IV heparin  and now transition to p.o. Eliquis .  Symptomatically improved Hospital course as below  Plan of care discussed with patient and son at the bedside.  Called wife Steve Rhodes - left voicemail. Called daughter Steve Rhodes and answered all questions  Acute Large Submassive Pulmonary embolism with cor pulmonale s/p mechanical thrombectomy - Denies prior history of VTE, no family history, denies smoking, recent surgery, prolonged travel, immobilization - Elevated Troponin suspect secondary to demand ischemia from PE - Leukocytosis suspect reactive to above - resolved - US  b/l neg for DVT   - Echo EF 60-65%, no RWMA, G1DD. IVS flattened consistent with right ventricular pressure and volume overload. Mild to mod TR - was on IV heparin  drip, transitioned to po Eliquis  - Follow up with Vascular surgery outpatient - hypercoagulable panel sent, hematology referral outpatient - PT/OT rec Home PT   Acute Hypoxic Respiratory Failure - resolved - due to above, now on RA at rest and on exertion   Hyperlipidemia Hypertension - continue  outpatient atorvastatin  - hold outpatient anti-hypertensive medications due to BP controlled off meds - resume outpatient as needed   Consultants: Vascular surgery, Intensivist Procedures performed: Mechanical thrombectomy  Disposition: Home, Home PT Diet recommendation:  Discharge Diet Orders (From admission, onward)     Start     Ordered   02/11/24 0000  Diet - low sodium heart healthy        02/11/24 1456             DISCHARGE MEDICATION: Allergies as of 02/11/2024       Reactions   Latex Hives, Rash   Sulfa Antibiotics Other (See Comments)   Reaction in childhood        Medication List     PAUSE taking these medications    lisinopril -hydrochlorothiazide  10-12.5 MG tablet Wait to take this until your doctor or other care provider tells you to start again. Commonly known as: ZESTORETIC  Take 1 tablet by mouth daily.       TAKE these medications    apixaban  5 MG Tabs tablet Commonly known as: ELIQUIS  Take 2 tablets (10 mg total) by mouth 2 (two) times daily for 6 days, THEN 1 tablet (5 mg total) 2 (two) times daily. Start taking on: February 11, 2024   atorvastatin  10 MG tablet Commonly known as: LIPITOR Take 10 mg by mouth daily.   calcium  carbonate 1500 (600 Ca) MG Tabs tablet Commonly known as: OSCAL Take 600 mg of elemental calcium  by mouth 2 (two) times daily with a meal.   calcium  carbonate 500 MG chewable tablet Commonly known as: TUMS - dosed in mg  elemental calcium  Chew 1 tablet by mouth daily as needed for indigestion or heartburn.   cholecalciferol 25 MCG (1000 UNIT) tablet Commonly known as: VITAMIN D3 Take 1,000 Units by mouth daily.   folic acid 1 MG tablet Commonly known as: FOLVITE TAKE 3 TABLETS BY MOUTH ONCE DAILY ON  YOUR  NON  METHOTREXATE  DAYS   glycerin  adult 2 g suppository Place 1 suppository rectally as needed for constipation.   ibuprofen 200 MG tablet Commonly known as: ADVIL Take 400 mg by mouth every 6 (six) hours  as needed.   methotrexate 2.5 MG tablet Commonly known as: RHEUMATREX Take 10 mg by mouth once a week.   multivitamin with minerals Tabs tablet Take 1 tablet by mouth daily.   omeprazole 40 MG capsule Commonly known as: PRILOSEC Take 40 mg by mouth daily.   polyethylene glycol 17 g packet Commonly known as: MiraLax  Take 17 g by mouth daily.   propranolol  20 MG tablet Commonly known as: INDERAL  Take 20 mg by mouth 2 (two) times daily.   valACYclovir 500 MG tablet Commonly known as: VALTREX Take 500 mg by mouth daily as needed (FOR SHINGLES FLARE-UP).   ZINC PO Take 1 capsule by mouth daily.   zolpidem 10 MG tablet Commonly known as: AMBIEN Take 10 mg by mouth at bedtime as needed for sleep.               Durable Medical Equipment  (From admission, onward)           Start     Ordered   02/11/24 1456  DME Walker  Once       Question Answer Comment  Walker: With 5 Inch Wheels   Patient needs a walker to treat with the following condition Weakness      02/11/24 1456            Follow-up Information     Pace, Steve R, NP Follow up in 6 week(s).   Specialty: Vascular Surgery Why: Post Procedure follow up. Contact information: [address] [place] [address] [phone]                Discharge Exam: Steve Rhodes   02/09/24 0315 02/10/24 1200 02/11/24 0259  Weight: 96.8 kg 101.8 kg 101.4 kg   General: no acute distress, resting in bed Neuro: A&O x 3, moving all extremities CV: rrr, s1s2, no murmurs PULM: clear to auscultation bilaterally. No wheezing GI: soft, non-tender, non-distended, BS+ Extremities: warm, no edema  Condition at discharge: good  The results of significant diagnostics from this hospitalization (including imaging, microbiology, ancillary and laboratory) are listed below for reference.   Imaging Studies: ECHOCARDIOGRAM COMPLETE Result Date: 02/09/2024    ECHOCARDIOGRAM REPORT   Patient  Name:   Steve Rhodes. Date of Exam: 02/09/2024 Medical Rec #:  [id number]        Height:       73.0 in Accession #:    [id number]       Weight:       213.4 lb Date of Birth:  [birth year]        BSA:          2.212 m Patient Age:    83 years         BP:           100/77 mmHg Patient Gender: M                HR:  92 bpm. Exam Location:  [facility] Procedure: 2D Echo, Color Doppler, Cardiac Doppler and Intracardiac            Opacification Agent (Both Spectral and Color Flow Doppler were            utilized during procedure). Indications:     Pulmonary Embolus I26.09  History:         Patient has no prior history of Echocardiogram examinations.                  Pulmonary Embolism.  Sonographer:     Ashley McNeely-Sloane Referring Phys:  [id number] SELINDA RAMAN DEW Diagnosing Phys: Evalene Lunger MD IMPRESSIONS  1. Left ventricular ejection fraction, by estimation, is 60 to 65%. The left ventricle has normal function. The left ventricle has no regional wall motion abnormalities. There is mild left ventricular hypertrophy. Left ventricular diastolic parameters are consistent with Grade I diastolic dysfunction (impaired relaxation). There is the interventricular septum is flattened in systole and diastole, consistent with right ventricular pressure and volume overload.  2. Right ventricular systolic function is moderately reduced. The right ventricular size is severely enlarged. There is moderately elevated pulmonary artery systolic pressure. The estimated right ventricular systolic pressure is 48.6 mmHg.  3. The mitral valve is normal in structure. Mild mitral valve regurgitation. No evidence of mitral stenosis.  4. Tricuspid valve regurgitation is mild to moderate.  5. The aortic valve is normal in structure. Aortic valve regurgitation is not visualized. No aortic stenosis is present.  6. The inferior vena cava is normal in size with greater than 50% respiratory variability, suggesting right atrial pressure of 3 mmHg.  FINDINGS  Left Ventricle: Left ventricular ejection fraction, by estimation, is 60 to 65%. The left ventricle has normal function. The left ventricle has no regional wall motion abnormalities. Definity  contrast agent was given IV to delineate the left ventricular  endocardial borders. Strain was performed and the global longitudinal strain is indeterminate. The left ventricular internal cavity size was normal in size. There is mild left ventricular hypertrophy. The interventricular septum is flattened in systole and diastole, consistent with right ventricular pressure and volume overload. Left ventricular diastolic parameters are consistent with Grade I diastolic dysfunction (impaired relaxation). Right Ventricle: The right ventricular size is severely enlarged. No increase in right ventricular wall thickness. Right ventricular systolic function is moderately reduced. There is moderately elevated pulmonary artery systolic pressure. The tricuspid regurgitant velocity is 3.30 m/s, and with an assumed right atrial pressure of 5 mmHg, the estimated right ventricular systolic pressure is 48.6 mmHg. Left Atrium: Left atrial size was normal in size. Right Atrium: Right atrial size was normal in size. Pericardium: There is no evidence of pericardial effusion. Mitral Valve: The mitral valve is normal in structure. Mild mitral valve regurgitation. No evidence of mitral valve stenosis. Tricuspid Valve: The tricuspid valve is normal in structure. Tricuspid valve regurgitation is mild to moderate. No evidence of tricuspid stenosis. Aortic Valve: The aortic valve is normal in structure. Aortic valve regurgitation is not visualized. No aortic stenosis is present. Aortic valve mean gradient measures 2.0 mmHg. Aortic valve peak gradient measures 3.1 mmHg. Aortic valve area, by VTI measures 2.62 cm. Pulmonic Valve: The pulmonic valve was normal in structure. Pulmonic valve regurgitation is not visualized. No evidence of pulmonic  stenosis. Aorta: The aortic root is normal in size and structure. Venous: The inferior vena cava is normal in size with greater than 50% respiratory variability, suggesting right atrial pressure of 3  mmHg. IAS/Shunts: No atrial level shunt detected by color flow Doppler. Additional Comments: 3D was performed not requiring image post processing on an independent workstation and was indeterminate.  LEFT VENTRICLE PLAX 2D LVIDd:         4.10 cm     Diastology LVIDs:         2.30 cm     LV e' medial:    5.55 cm/s LV PW:         1.50 cm     LV E/e' medial:  7.7 LV IVS:        0.90 cm     LV e' lateral:   8.81 cm/s LVOT diam:     1.90 cm     LV E/e' lateral: 4.9 LV SV:         38 LV SV Index:   17 LVOT Area:     2.84 cm  LV Volumes (MOD) LV vol d, MOD A2C: 39.6 ml LV vol d, MOD A4C: 20.5 ml LV vol s, MOD A2C: 10.0 ml LV vol s, MOD A4C: 9.7 ml LV SV MOD A2C:     29.6 ml LV SV MOD A4C:     20.5 ml LV SV MOD BP:      19.3 ml RIGHT VENTRICLE RV Basal diam:  5.10 cm RV Mid diam:    4.30 cm RV S prime:     6.42 cm/s TAPSE (M-mode): 0.8 cm LEFT ATRIUM             Index        RIGHT ATRIUM           Index LA diam:        2.60 cm 1.18 cm/m   RA Area:     13.50 cm LA Vol (A2C):   60.4 ml 27.31 ml/m  RA Volume:   31.40 ml  14.20 ml/m LA Vol (A4C):   16.6 ml 7.51 ml/m LA Biplane Vol: 34.3 ml 15.51 ml/m  AORTIC VALVE AV Area (Vmax):    2.83 cm AV Area (Vmean):   2.62 cm AV Area (VTI):     2.62 cm AV Vmax:           87.80 cm/s AV Vmean:          60.100 cm/s AV VTI:            0.145 m AV Peak Grad:      3.1 mmHg AV Mean Grad:      2.0 mmHg LVOT Vmax:         87.50 cm/s LVOT Vmean:        55.500 cm/s LVOT VTI:          0.134 m LVOT/AV VTI ratio: 0.92  AORTA Ao Root diam: 3.50 cm Ao Asc diam:  3.50 cm MITRAL VALVE               TRICUSPID VALVE MV Area (PHT): 4.49 cm    TR Peak grad:   43.6 mmHg MV Decel Time: 169 msec    TR Mean grad:   26.0 mmHg MV E velocity: 43.00 cm/s  TR Vmax:        330.00 cm/s MV A velocity: 91.70 cm/s   TR Vmean:       237.0 cm/s MV E/A ratio:  0.47                            SHUNTS  Systemic VTI:  0.13 m                            Systemic Diam: 1.90 cm Evalene Lunger MD Electronically signed by Evalene Lunger MD Signature Date/Time: 02/09/2024/3:41:49 PM    Final    DG Chest Port 1 View Result Date: 02/09/2024 CLINICAL DATA:  [id number] Acute hypoxic respiratory failure (HCC) [id number] EXAM: PORTABLE CHEST - 1 VIEW COMPARISON:  February 08, 2024 FINDINGS: No focal airspace consolidation, large pleural effusion, or pneumothorax. Mild cardiomegaly. Tortuous aorta with aortic atherosclerosis. No acute fracture or destructive lesions. Multilevel thoracic osteophytosis. IMPRESSION: Cardiomegaly.  No pneumonia or pulmonary edema. Electronically Signed   By: Rogelia Myers M.D.   On: 02/09/2024 10:57   PERIPHERAL VASCULAR CATHETERIZATION Result Date: 02/09/2024 See surgical note for result.  CT Angio Chest PE W and/or Wo Contrast Addendum Date: 02/09/2024 ADDENDUM REPORT: 02/09/2024 05:08 ADDENDUM: Critical Value/emergent results were called by telephone at the time of interpretation on 02/09/2024 at 1:42 am to provider [facility] , who verbally acknowledged these results. Electronically Signed   By: Francis Quam M.D.   On: 02/09/2024 05:08   Result Date: 02/09/2024 CLINICAL DATA:  Positive D-dimer, weakness, and shortness of breath. Hypoxia on EMS arrival. EXAM: CT ANGIOGRAPHY CHEST WITH CONTRAST TECHNIQUE: Multidetector CT imaging of the chest was performed using the standard protocol during bolus administration of intravenous contrast. Multiplanar CT image reconstructions and MIPs were obtained to evaluate the vascular anatomy. RADIATION DOSE REDUCTION: This exam was performed according to the departmental dose-optimization program which includes automated exposure control, adjustment of the mA and/or kV according to patient size and/or use of iterative reconstruction technique.  CONTRAST:  75mL OMNIPAQUE  IOHEXOL  350 MG/ML SOLN COMPARISON:  CT abdomen pelvis with IV contrast 01/05/2024, portable chest today, PA Lat chest 10/04/2009. No prior chest CT or CTA. FINDINGS: Cardiovascular: Large volume of bilateral upper and lower lobe arterial embolic disease with right heart strain. There is mild cardiomegaly with a right chamber predominance, IVC and hepatic vein reflux, and bowing of the interventricular septum to the left, all in keeping with right heart strain. There is a band of thrombus occupying the right atrium, a prominent pulmonary trunk measuring 3.2 cm, linear thrombus in the right main pulmonary artery, and nearly occlusive thrombus in both distal main pulmonary arteries. There are near occlusive emboli in both upper and lower lobe main arteries and the interlobar pulmonary artery, near occlusive segmental emboli in the right middle lobe, and a combination of occlusive and near occlusive multifocal segmental and subsegmental emboli in the upper and lower lobes. There is a small pericardial effusion new from 01/05/2024. the pulmonary veins are normal caliber. The aorta is tortuous with trace calcific plaques in the descending segment. There is no aneurysm or dissection. The great vessels are unremarkable. Mediastinum/Nodes: No enlarged mediastinal, hilar, or axillary lymph nodes. Thyroid gland, trachea, and esophagus demonstrate no significant findings. Lungs/Pleura: There is a new pleural-based opacity posteriorly in the right lower lobe which is probably a developing infarct. There is a 6 mm right lower lobe nodule in the superior segment on 5:76 which is above the plane of all prior abdomen and pelvis CTs. Remaining bilateral lungs are essentially clear. There are trace pleural effusions. There is chronic linear scarring in the base of the lingula and base of the right middle lobe. Upper Abdomen: No acute abnormality. Abdominal aortic atherosclerosis. Musculoskeletal: There is  degenerative change and  kyphosis of the thoracic spine, slight dextroscoliosis. No acute or significant osseous abnormality. Unremarkable chest wall. Review of the MIP images confirms the above findings. IMPRESSION: 1. Large volume of bilateral upper and lower lobe arterial embolic disease with right heart strain. CT evidence of right heart strain (RV/LV Ratio = 1.37) consistent with at least submassive (intermediate risk) PE. The presence of right heart strain has been associated with an increased risk of morbidity and mortality. 2. Right atrial thrombus, prominent pulmonary trunk, and linear thrombus in the right main pulmonary artery. 3. Near occlusive emboli in both upper and lower lobe main arteries and the interlobar pulmonary artery, near occlusive segmental emboli in the right middle lobe, and a combination of occlusive and near occlusive multifocal segmental and subsegmental emboli in the upper and lower lobes. 4. New pleural-based opacity posteriorly in the right lower lobe which is probably a developing infarct. 5. 6 mm right lower lobe nodule. Non-contrast chest CT at 6-12 months is recommended. If the nodule is stable at time of repeat CT, then future CT at 18-24 months (from today's scan) is considered optional for low-risk patients, but is recommended for high-risk patients. This recommendation follows the consensus statement: Guidelines for Management of Incidental Pulmonary Nodules Detected on CT Images: From the Fleischner Society 2017; Radiology 2017; 284:228-243. 6. Trace pleural effusions. 7. Aortic atherosclerosis. 8. PRA is attempting to reach the ordering physician for stat notification at the time of signing. Aortic Atherosclerosis (ICD10-I70.0). Electronically Signed: By: Francis Quam M.D. On: 02/09/2024 01:36   US  Venous Img Lower Bilateral (DVT) Result Date: 02/09/2024 CLINICAL DATA:  Large volume bilateral pulmonary embolic burden. Assess for DVT. [id number]. EXAM: BILATERAL LOWER  EXTREMITY VENOUS DOPPLER ULTRASOUND TECHNIQUE: Gray-scale sonography with graded compression, as well as color Doppler and duplex ultrasound were performed to evaluate the lower extremity deep venous systems from the level of the common femoral vein and including the common femoral, femoral, profunda femoral, popliteal and calf veins including the posterior tibial, peroneal and gastrocnemius veins when visible. The superficial great saphenous vein was also interrogated. Spectral Doppler was utilized to evaluate flow at rest and with distal augmentation maneuvers in the common femoral, femoral and popliteal veins. COMPARISON:  None Available. FINDINGS: RIGHT LOWER EXTREMITY Common Femoral Vein: No evidence of thrombus. Normal compressibility, respiratory phasicity and response to augmentation. Saphenofemoral Junction: No evidence of thrombus. Normal compressibility and flow on color Doppler imaging. Profunda Femoral Vein: No evidence of thrombus. Normal compressibility and flow on color Doppler imaging. Femoral Vein: No evidence of thrombus. Normal compressibility, respiratory phasicity and response to augmentation. Popliteal Vein: No evidence of thrombus. Normal compressibility, respiratory phasicity and response to augmentation. Calf Veins: No evidence of thrombus. Normal compressibility and flow on color Doppler imaging. Superficial Great Saphenous Vein: No evidence of thrombus. Normal compressibility. Venous Reflux:  None. Other Findings:  None. LEFT LOWER EXTREMITY Common Femoral Vein: No evidence of thrombus. Normal compressibility, respiratory phasicity and response to augmentation. Saphenofemoral Junction: No evidence of thrombus. Normal compressibility and flow on color Doppler imaging. Profunda Femoral Vein: No evidence of thrombus. Normal compressibility and flow on color Doppler imaging. Femoral Vein: No evidence of thrombus. Normal compressibility, respiratory phasicity and response to augmentation.  Popliteal Vein: No evidence of thrombus. Normal compressibility, respiratory phasicity and response to augmentation. Calf Veins: No evidence of thrombus. Normal compressibility and flow on color Doppler imaging. Superficial Great Saphenous Vein: No evidence of thrombus. Normal compressibility. Venous Reflux:  None. Other Findings:  None. IMPRESSION: No  evidence of deep venous thrombosis in either lower extremity. Electronically Signed   By: Francis Quam M.D.   On: 02/09/2024 05:07   DG Chest 1 View Result Date: 02/09/2024 CLINICAL DATA:  Shortness of breath and fall EXAM: CHEST  1 VIEW COMPARISON:  10/04/2009 FINDINGS: Stable cardiomediastinal silhouette. Bibasilar atelectasis. The lungs are otherwise clear. No pleural effusion or pneumothorax. No displaced rib fracture. IMPRESSION: No active disease. Electronically Signed   By: Norman Gatlin M.D.   On: 02/09/2024 00:05    Microbiology: Results for orders placed or performed during the hospital encounter of 02/08/24  Resp panel by RT-PCR (RSV, Flu A&B, Covid) Anterior Nasal Swab     Status: None   Collection Time: 02/08/24 11:56 PM   Specimen: Anterior Nasal Swab  Result Value Ref Range Status   SARS Coronavirus 2 by RT PCR NEGATIVE NEGATIVE Final    Comment: (NOTE) SARS-CoV-2 target nucleic acids are NOT DETECTED.  The SARS-CoV-2 RNA is generally detectable in upper respiratory specimens during the acute phase of infection. The lowest concentration of SARS-CoV-2 viral copies this assay can detect is 138 copies/mL. A negative result does not preclude SARS-Cov-2 infection and should not be used as the sole basis for treatment or other patient management decisions. A negative result may occur with  improper specimen collection/handling, submission of specimen other than nasopharyngeal swab, presence of viral mutation(s) within the areas targeted by this assay, and inadequate number of viral copies(<138 copies/mL). A negative result must be  combined with clinical observations, patient history, and epidemiological information. The expected result is Negative.  Fact Sheet for Patients:  BloggerCourse.com  Fact Sheet for Healthcare Providers:  SeriousBroker.it  This test is no t yet approved or cleared by the [place]  FDA and  has been authorized for detection and/or diagnosis of SARS-CoV-2 by FDA under an Emergency Use Authorization (EUA). This EUA will remain  in effect (meaning this test can be used) for the duration of the COVID-19 declaration under Section 564(b)(1) of the Act, 21 U.S.C.section 360bbb-3(b)(1), unless the authorization is terminated  or revoked sooner.       Influenza A by PCR NEGATIVE NEGATIVE Final   Influenza B by PCR NEGATIVE NEGATIVE Final    Comment: (NOTE) The Xpert Xpress SARS-CoV-2/FLU/RSV plus assay is intended as an aid in the diagnosis of influenza from Nasopharyngeal swab specimens and should not be used as a sole basis for treatment. Nasal washings and aspirates are unacceptable for Xpert Xpress SARS-CoV-2/FLU/RSV testing.  Fact Sheet for Patients: BloggerCourse.com  Fact Sheet for Healthcare Providers: SeriousBroker.it  This test is not yet approved or cleared by the [place]  FDA and has been authorized for detection and/or diagnosis of SARS-CoV-2 by FDA under an Emergency Use Authorization (EUA). This EUA will remain in effect (meaning this test can be used) for the duration of the COVID-19 declaration under Section 564(b)(1) of the Act, 21 U.S.C. section 360bbb-3(b)(1), unless the authorization is terminated or revoked.     Resp Syncytial Virus by PCR NEGATIVE NEGATIVE Final    Comment: (NOTE) Fact Sheet for Patients: BloggerCourse.com  Fact Sheet for Healthcare Providers: SeriousBroker.it  This test is not yet  approved or cleared by the [place]  FDA and has been authorized for detection and/or diagnosis of SARS-CoV-2 by FDA under an Emergency Use Authorization (EUA). This EUA will remain in effect (meaning this test can be used) for the duration of the COVID-19 declaration under Section 564(b)(1) of the Act, 21 U.S.C. section 360bbb-3(b)(1),  unless the authorization is terminated or revoked.  Performed at [facility], [address] [place], [place] [address]   MRSA Next Gen by PCR, Nasal     Status: None   Collection Time: 02/09/24  2:30 AM   Specimen: Nasal Mucosa; Nasal Swab  Result Value Ref Range Status   MRSA by PCR Next Gen NOT DETECTED NOT DETECTED Final    Comment: (NOTE) The GeneXpert MRSA Assay (FDA approved for NASAL specimens only), is one component of a comprehensive MRSA colonization surveillance program. It is not intended to diagnose MRSA infection nor to guide or monitor treatment for MRSA infections. Test performance is not FDA approved in patients less than 78 years old. Performed at [facility], [address] [place], [place] [address]     Labs: CBC: Recent Labs  Lab 02/08/24 2356 02/09/24 0358 02/09/24 1118 02/10/24 0208 02/11/24 0500  WBC 13.2* 9.5 7.7 14.0* 9.2  NEUTROABS  --   --  6.3  --   --   HGB 14.0 14.2 13.1 11.8* 11.4*  HCT 43.5 44.2 41.3 36.9* 34.7*  MCV 98.6 98.4 97.4 98.7 95.9  PLT 190 189 194 158 183   Basic Metabolic Panel: Recent Labs  Lab 02/08/24 2356 02/09/24 0358 02/09/24 1118 02/10/24 0435 02/11/24 0500  NA 141 138 139 138 138  K 3.1* 4.2 4.4 4.1 4.3  CL 105 105 109 109 106  CO2 26 23 20* 22 24  GLUCOSE 181* 245* 204* 123* 113*  BUN 20 20 21  27* 23  CREATININE 1.16 1.14 1.08 1.19 1.14  CALCIUM  8.4* 8.7* 8.4* 8.4* 8.1*  MG  --  2.3  --  2.2  --   PHOS  --  2.6  --  2.8  --    Liver Function Tests: Recent Labs  Lab 02/09/24 1118  AST 26  ALT 24  ALKPHOS 50  BILITOT 0.5  PROT 6.2*   ALBUMIN 2.9*   CBG: Recent Labs  Lab 02/10/24 2011 02/10/24 2355 02/11/24 0407 02/11/24 0728 02/11/24 1154  GLUCAP 142* 118* 113* 105* 99    Discharge time spent: greater than 30 minutes.  Signed: [name], MD [facility] 02/11/2024

## 2024-02-11 NOTE — Plan of Care (Signed)

## 2024-02-11 NOTE — Progress Notes (Signed)
 O2 sats dropped to 90-91% per cardiac monitoring alarm while sleeping. New pulse ox applied and no change. Pt was placed on 2 liters via nasal cannula (existing order, if needed). Pt's oxygen level increased to 95%.

## 2024-02-11 NOTE — Evaluation (Signed)
 Physical Therapy Evaluation Patient Details Name: Steve Rhodes. MRN: [id number] DOB: June 04, 1941 Today's Date: 02/11/2024  History of Present Illness  83 yo M with PMH HTN, HLD presenting to [facility] ED from home via EMS for evaluation of weakness and dyspnea.  Workup revealed large submassive PE, seen by vascular surgery underwent mechanical thrombectomy on 02/09/24.  Clinical Impression  Pt is a pleasant 83 year old male who was admitted for acute PE. Pt performs transfers and ambulation with min A-CGA. He ambulates with a very narrow BOS with his RLE crossing midline intermittently. 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 Otherwise, pt reports not using AD for community ambulation. Pt was independent with ADLs. Pt now demonstrates deficits with activity tolerance and balance. During PT session, pt was able to tolerate exerting activities on RA. Vitals stayed WNL throughout session. Pt left on RA. Would benefit from skilled PT to address above deficits and promote optimal return to PLOF.          If plan is discharge home, recommend the following: A little help with walking and/or transfers;A little help with bathing/dressing/bathroom;Assistance with cooking/housework   Can travel by private Tax inspector (2 wheels)  Recommendations for Other Services       Functional Status Assessment Patient has had a recent decline in their functional status and demonstrates the ability to make significant improvements in function in a reasonable and predictable amount of time.     Precautions / Restrictions Precautions Precautions: Fall Restrictions Weight Bearing Restrictions Per Provider Order: No      Mobility  Bed Mobility Overal bed mobility: Modified Independent             General bed mobility comments: NT. OT hand of to PT this session while standing at EOB.     Transfers Overall transfer level: Needs assistance Equipment used: Rolling walker (2 wheels) Transfers: Sit to/from Stand Sit to Stand: Min assist           General transfer comment: Verbal cuing for hand placement. Min physical assist for eccentric phase of sitting. BUE used to lower self into recliner.    Ambulation/Gait Ambulation/Gait assistance: Contact guard assist Gait Distance (Feet): 100 Feet Assistive device: Rolling walker (2 wheels) Gait Pattern/deviations: Step-through pattern, Narrow base of support Gait velocity: decreased     General Gait Details: Tendency for RLE to cross midline when walking straight. Very narrow BOS with turning. Verbal cues for RW management.  Stairs            Wheelchair Mobility     Tilt Bed    Modified Rankin (Stroke Patients Only)       Balance Overall balance assessment: Needs assistance Sitting-balance support: No upper extremity supported, Feet supported Sitting balance-Leahy Scale: Good Sitting balance - Comments: Able to maintain sitting balance in recliner. Good trunk control and forward lean when taking gait belt off.   Standing balance support: Bilateral upper extremity supported, During functional activity, Reliant on assistive device for balance Standing balance-Leahy Scale: Fair Standing balance comment: Pt able to stand for prolonged period of time. BUE support on RW for balance.                             Pertinent Vitals/Pain Pain Assessment Pain Assessment: No/denies pain    Home Living Family/patient  expects to be discharged to:: Private residence Living Arrangements: Spouse/significant other Available Help at Discharge: Family;Available 24 hours/day Type of Home: House Home Access: Stairs to enter   [employer] of Steps: 1+1   Home Layout: One level Home Equipment: Cane - single point;BSC/3in1;Shower seat - built in;Hand held shower head Additional Comments: BSC was  his wife's    Prior Function Prior Level of Function : Independent/Modified Independent;Driving             Mobility Comments: SPC outside (lives on farm). 1 near fall in the past 6 months. 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 A little anxious to participate in therapy without O2. O2 tank kept close by in case Following commands: Intact       Cueing Cueing Techniques: Verbal cues     General Comments General comments (skin integrity, edema, etc.): SpO2 at 93% at the lowest. Pt able to tolerate ambulation on RA.    Exercises     Assessment/Plan    PT Assessment Patient needs continued PT services  PT Problem List Decreased balance;Decreased activity tolerance;Decreased knowledge of use of DME       PT Treatment Interventions Gait training;DME instruction;Functional mobility training;Therapeutic activities;Therapeutic exercise;Balance training;Patient/family education    PT Goals (Current goals can be found in the Care Plan section)  Acute Rehab PT Goals Patient Stated Goal: To go home safely PT Goal Formulation: With patient Time For Goal Achievement: 02/25/24 Potential to Achieve Goals: Good    Frequency Min 2X/week     Co-evaluation               AM-PAC PT 6 Clicks Mobility  Outcome Measure Help needed turning from your back to your side  while in a flat bed without using bedrails?: A Little Help needed moving from lying on your back to sitting on the side of a flat bed without using bedrails?: A Little Help needed moving to and from a bed to a chair (including a wheelchair)?: A Little Help needed standing up from a chair using your arms (e.g., wheelchair or bedside chair)?: A Little Help needed to walk in hospital room?: A Little Help needed climbing 3-5 steps with a railing? : A Little 6 Click Score: 18    End of Session Equipment Utilized During Treatment: Gait belt Activity Tolerance: Patient tolerated treatment well Patient left: in chair;with call bell/phone within reach;with family/visitor present (MD present) Nurse Communication: Mobility status PT Visit Diagnosis: Unsteadiness on feet (R26.81)    Time: 1045-1100 PT Time Calculation (min) (ACUTE ONLY): 15 min   Charges:                 Valmore Arabie, SPT   [place] 02/11/2024, 11:37 AM

## 2024-02-12 ENCOUNTER — Other Ambulatory Visit: Payer: Self-pay

## 2024-02-12 LAB — BETA-2-GLYCOPROTEIN I ABS, IGG/M/A
Beta-2 Glyco I IgG: <9 GPI IgG units (ref 0–20)
Beta-2-Glycoprotein I IgA: <9 GPI IgA units (ref 0–25)
Beta-2-Glycoprotein I IgM: <9 GPI IgM units (ref 0–32)

## 2024-02-12 LAB — HOMOCYSTEINE: Homocysteine: 16.2 umol/L (ref 0.0–21.3)

## 2024-02-12 LAB — GLUCOSE, CAPILLARY
Glucose-Capillary: 103 mg/dL — ABNORMAL HIGH (ref 70–99)
Glucose-Capillary: 108 mg/dL — ABNORMAL HIGH (ref 70–99)

## 2024-02-12 MED ORDER — APIXABAN 5 MG PO TABS: ORAL_TABLET | ORAL | 0 refills | Status: DC

## 2024-02-12 MED ORDER — APIXABAN 5 MG PO TABS: ORAL_TABLET | ORAL | Status: DC

## 2024-02-12 MED ORDER — APIXABAN 5 MG PO TABS
ORAL_TABLET | ORAL | 0 refills | Status: DC
  Filled 2024-02-12: qty 200, 95d supply, fill #0

## 2024-02-12 NOTE — Plan of Care (Signed)

## 2024-02-12 NOTE — Progress Notes (Signed)
 Reviewed discharge instructions with patient. Patient acknowledged understanding. Patient discharged with personal belongings. Patient wheeled out by staff. Patient transported home via family vehicle. No distress noted in patient.

## 2024-02-12 NOTE — Plan of Care (Addendum)
 Plan was to discharge patient yesterday Due to delay in delivering bedside DME equipment and Home PT, patient is being discharged today   Home oxygen eval - saturating 97% at rest on room air, on exertion saturating 96% on RA Not indicated  Had hypoxia overnight - sleep clinic referral for sleep study provided  Discharged home with Home PT, rest of the details as per Discharge summary

## 2024-02-12 NOTE — Plan of Care (Signed)

## 2024-02-12 NOTE — TOC Initial Note (Signed)
 Transition of Care (TOC) - Initial/Assessment Note    Patient Details  Name: Steve Rhodes. MRN: [id number] Date of Birth: 12/11/1940  Transition of [facility]) CM/SW Contact:    [place] L Pete Schnitzer, LCSW Phone Number: 02/12/2024, 8:34 AM  Clinical Narrative:                  CSW notified of patients discharge. Oxygen is being checked again to verify if patient will need it to discharge.   CSW spoke with patient regarding home health. Patient advised of no preferences and stated that he and his wife do physical therapy together at home independently. CSW discussed the benefits of working with a professional. CSW advised that based on the insurance, [facility] is accepting new patients. Patient agreed to referral being made with [facility].   CSW discussed DME. Patient advised that he would like the walker ordered through [facility]. RW ordered.   CSW spoke with [facility], to confirm that services can be provided to patient for PT. Services confirmed.         Patient Goals and CMS Choice            Expected Discharge Plan and Services         Expected Discharge Date: 02/11/24                                    Prior Living Arrangements/Services                       Activities of Daily Living   ADL Screening (condition at time of admission) Independently performs ADLs?: No Does the patient have a NEW difficulty with bathing/dressing/toileting/self-feeding that is expected to last >3 days?: Yes (Initiates electronic notice to provider for possible OT consult) Does the patient have a NEW difficulty with getting in/out of bed, walking, or climbing stairs that is expected to last >3 days?: Yes (Initiates electronic notice to provider for possible PT consult) Does the patient have a NEW difficulty with communication that is expected to last >3 days?: No Is the patient deaf or have difficulty hearing?: No Does the patient have difficulty seeing, even when wearing  glasses/contacts?: No Does the patient have difficulty concentrating, remembering, or making decisions?: No  Permission Sought/Granted                  Emotional Assessment              Admission diagnosis:  Acute pulmonary embolism with acute cor pulmonale, unspecified pulmonary embolism type (HCC) [I26.09] Patient Active Problem List   Diagnosis Date Noted   Acute pulmonary embolism with acute cor pulmonale (HCC) 02/09/2024   Cor pulmonale (HCC) 02/09/2024   Constipation 01/06/2024   History of melanoma 01/06/2024   Hypertension    Gastroesophageal reflux disease 11/12/2017   Abnormal biliary HIDA scan 11/12/2017   PCP:  [name], MD Pharmacy:   [facility] [address], [place] - [address] [place] [address] Phone: [phone] Fax: [phone]  [facility] [address] [place] [address] Phone: [phone] Fax: [phone]     Social Drivers of Health (SDOH) Social History: SDOH Screenings   Food Insecurity: No Food Insecurity (02/09/2024)  Housing: Low Risk  (02/10/2024)  Transportation Needs: No Transportation Needs (02/09/2024)  Utilities: Not At Risk (02/09/2024)  Financial Resource Strain: Low Risk  (01/21/2024)   Received from [facility]  Social Connections: Moderately Integrated (02/09/2024)  Tobacco Use: Low Risk  (02/08/2024)   SDOH Interventions:     Readmission Risk Interventions     No data to display

## 2024-02-13 LAB — PROTEIN S, TOTAL: Protein S Ag, Total: 88 % (ref 60–150)

## 2024-02-13 LAB — CARDIOLIPIN ANTIBODIES, IGG, IGM, IGA
Anticardiolipin IgA: <9 U/mL (ref 0–11)
Anticardiolipin IgG: <9 GPL U/mL (ref 0–14)
Anticardiolipin IgM: <9 [MPL'U]/mL (ref 0–12)

## 2024-02-13 LAB — PROTEIN S ACTIVITY: Protein S Activity: 76 % (ref 63–140)

## 2024-02-13 LAB — PROTEIN C ACTIVITY: Protein C Activity: 71 % — ABNORMAL LOW (ref 73–180)

## 2024-02-14 LAB — LUPUS ANTICOAGULANT PANEL
DRVVT: 52 s — ABNORMAL HIGH (ref 0.0–47.0)
DRVVT: 74.7 s — ABNORMAL HIGH (ref 0.0–47.0)
PTT Lupus Anticoagulant: 49.9 s — ABNORMAL HIGH (ref 0.0–43.5)
PTT Lupus Anticoagulant: 39.4 s (ref 0.0–43.5)

## 2024-02-14 LAB — PROTHROMBIN GENE MUTATION

## 2024-02-14 LAB — FACTOR 5 LEIDEN

## 2024-02-14 LAB — DRVVT CONFIRM
dRVVT Confirm: 0.9 ratio (ref 0.8–1.2)
dRVVT Confirm: 0.8 ratio (ref 0.8–1.2)

## 2024-02-14 LAB — HEXAGONAL PHASE PHOSPHOLIPID: Hexagonal Phase Phospholipid: 5 s (ref 0–11)

## 2024-02-14 LAB — DRVVT MIX
dRVVT Mix: 45.7 s — ABNORMAL HIGH (ref 0.0–40.4)
dRVVT Mix: 57 s — ABNORMAL HIGH (ref 0.0–40.4)

## 2024-02-14 LAB — PTT-LA MIX: PTT-LA Mix: 47.7 s — ABNORMAL HIGH (ref 0.0–40.5)

## 2024-02-14 LAB — PROTEIN C, TOTAL: Protein C, Total: 62 % (ref 60–150)

## 2024-02-15 LAB — PROTEIN C, TOTAL: Protein C, Total: 77 % (ref 60–150)

## 2024-02-16 LAB — PROTHROMBIN GENE MUTATION

## 2024-03-01 ENCOUNTER — Inpatient Hospital Stay: Attending: Oncology | Admitting: Oncology

## 2024-03-01 ENCOUNTER — Encounter: Payer: Self-pay | Admitting: Oncology

## 2024-03-01 ENCOUNTER — Other Ambulatory Visit: Payer: Self-pay

## 2024-03-01 ENCOUNTER — Inpatient Hospital Stay

## 2024-03-01 VITALS — BP 124/71 | HR 66 | Temp 97.8°F | Resp 18 | Wt 209.8 lb

## 2024-03-01 DIAGNOSIS — R911 Solitary pulmonary nodule: Secondary | ICD-10-CM | POA: Diagnosis not present

## 2024-03-01 DIAGNOSIS — Z809 Family history of malignant neoplasm, unspecified: Secondary | ICD-10-CM | POA: Diagnosis not present

## 2024-03-01 DIAGNOSIS — I2609 Other pulmonary embolism with acute cor pulmonale: Secondary | ICD-10-CM | POA: Insufficient documentation

## 2024-03-01 DIAGNOSIS — I10 Essential (primary) hypertension: Secondary | ICD-10-CM | POA: Diagnosis not present

## 2024-03-01 DIAGNOSIS — Z7901 Long term (current) use of anticoagulants: Secondary | ICD-10-CM | POA: Insufficient documentation

## 2024-03-01 MED ORDER — APIXABAN 5 MG PO TABS
5.0000 mg | ORAL_TABLET | Freq: Two times a day (BID) | ORAL | 0 refills | Status: AC
Start: 2024-05-12 — End: ?
  Filled 2024-03-01 – 2024-03-14 (×3): qty 180, 90d supply, fill #0

## 2024-03-01 MED ORDER — APIXABAN 5 MG PO TABS: 5.0000 mg | ORAL_TABLET | Freq: Two times a day (BID) | ORAL | 0 refills | Status: DC

## 2024-03-01 NOTE — Assessment & Plan Note (Signed)
 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  Continue anticoagulation Eliquis  5mg  BID, plan 6 months. After that consider low dose prophylaxis.   Hypercoagulable work up  Negative cadiolipin abs, beta 2 glycoprotein abs, no lupus anticoagulant,  negative prothrombin gene and factor 5 leiden mutation. Normal protein S Ag and activity, normal protein C Ag, decreased protein C activity 71% decreased antithrombin III   I recommend to repeat protein C activity and antithrombin III  level in the future. Possible due to consumption during acute phase.

## 2024-03-01 NOTE — Progress Notes (Addendum)
 Hematology/Oncology Consult note [phone] [phone]        REFERRING PROVIDER: Jerelene Critchley, MD   CHIEF COMPLAINTS/REASON FOR VISIT:  Evaluation of bilateral pulmonary embolism.    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  Continue anticoagulation Eliquis  5mg  BID, plan 6 months. After that consider low dose prophylaxis.   Hypercoagulable work up  Negative cadiolipin abs, beta 2 glycoprotein abs, no lupus anticoagulant,  negative prothrombin gene and factor 5 leiden mutation. Normal protein S Ag and activity, normal protein C Ag, decreased protein C activity 71% decreased antithrombin III   I recommend to repeat protein C activity and antithrombin III  level in the future. Possible due to consumption during acute phase.   Lung nodule 6mm RLL nodule, repeat CT in 6 -12 months.    Orders Placed This Encounter  Procedures   Comprehensive metabolic panel with GFR    Standing Status:   Future    Expected Date:   05/31/2024    Expiration Date:   08/29/2024   CBC with Differential/Platelet    Standing Status:   Future    Expected Date:   05/31/2024    Expiration Date:   08/29/2024   Antithrombin III     Standing Status:   Future    Expected Date:   05/31/2024    Expiration Date:   08/29/2024   Protein C activity    Standing Status:   Future    Expected Date:   05/31/2024    Expiration Date:   08/29/2024   D-dimer, quantitative    Standing Status:   Future    Expected Date:   05/31/2024    Expiration Date:   03/01/2025   Follow up in 3 months  All questions were answered. The patient knows to call the clinic with any problems, questions or concerns.  Zelphia Cap, MD, PhD [facility] Hematology Oncology 03/01/2024   HISTORY OF PRESENTING ILLNESS:   Steve Rhodes. is a  83 y.o.  male with PMH listed below was seen in consultation at the request of  Jerelene Critchley, MD  for evaluation of  bilateral pulmonary embolism.   Discussed the use of AI scribe software for clinical note transcription with the patient, who gave verbal consent to proceed.   He was recently hospitalized for acute shortness of breath that began a few days prior to his emergency room visit. He experienced an episode of severe dyspnea after using the bathroom and sitting in his lounge chair, prompting his wife to call 911. The patient underwent emergency surgery, CTA showed Large volume of bilateral PE, underwent mechanical thrombectomy.   There are no known triggers for the pulmonary embolism, such as recent immobilization, long travel, or injury. He had been hospitalized in July for constipation, during which he underwent an endoscopy and colonoscopy. The colonoscopy prep was inadequate, and he was treated with Miralax  and Metamucil. There is uncertainty if this prior hospitalization could have contributed to the current clotting event.  Echo EF 60-65%, G1 Diastolic dysfunction. IVS flattened consistent with right ventricular pressure and volume overload   He is currently on Eliquis ,5mg  BID.  SOB has improved. No chest pain. He experiences dizziness upon standing and occasional light chest pain, which he did not have prior to this event. He uses a cane for stability.  His family history includes his mother having cancer, for which she received chemotherapy. He denies any personal or family history of blood clots.  He has never smoked, although he was exposed to secondhand smoke from his parents.  His hypercoagulable work up showed  Negative cadiolipin abs, beta 2 glycoprotein abs, no lupus anticoagulant,  negative prothrombin gene and factor 5 leiden mutation. 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   Cancer (HCC)    skin cancer   Colon polyp  04/2015   TUBULAR ADENOMA   GERD (gastroesophageal reflux disease)    Grover's disease    Hyperlipidemia    Hypertension    Mitral valve disorder     SURGICAL HISTORY: Past Surgical History:  Procedure Laterality Date   CARDIAC CATHETERIZATION Left 01/28/2016   Procedure: Left Heart Cath and Coronary Angiography;  Surgeon: Vinie DELENA Jude, MD;  Location: [facility] INVASIVE CV LAB;  Service: Cardiovascular;  Laterality: Left;   COLONOSCOPY N/A 01/31/2024   Procedure: COLONOSCOPY;  Surgeon: Onita Elspeth Sharper, DO;  Location: [facility] ENDOSCOPY;  Service: Gastroenterology;  Laterality: N/A;   COLONOSCOPY WITH PROPOFOL  N/A 05/10/2015   Procedure: COLONOSCOPY WITH PROPOFOL ;  Surgeon: Deward CINDERELLA Piedmont, MD;  Location: [facility] ENDOSCOPY;  Service: Gastroenterology;  Laterality: N/A;   ESOPHAGOGASTRODUODENOSCOPY N/A 01/31/2024   Procedure: EGD (ESOPHAGOGASTRODUODENOSCOPY);  Surgeon: Onita Elspeth Sharper, DO;  Location[facility] ENDOSCOPY;  Service: Gastroenterology;  Laterality: N/A;   ESOPHAGOGASTRODUODENOSCOPY (EGD) WITH PROPOFOL  N/A 05/10/2015   Procedure: ESOPHAGOGASTRODUODENOSCOPY (EGD) WITH PROPOFOL ;  Surgeon: Deward CINDERELLA Piedmont, MD;  Location: [facility] ENDOSCOPY;  Service: Gastroenterology;  Laterality: N/A;   ESOPHAGOGASTRODUODENOSCOPY (EGD) WITH PROPOFOL  N/A 06/10/2015   Procedure: ESOPHAGOGASTRODUODENOSCOPY (EGD) WITH PROPOFOL ;  Surgeon: Deward CINDERELLA Piedmont, MD;  Location: [facility] ENDOSCOPY;  Service: Gastroenterology;  Laterality: N/A;   ESOPHAGOGASTRODUODENOSCOPY (EGD) WITH PROPOFOL  N/A 06/29/2016   Procedure: ESOPHAGOGASTRODUODENOSCOPY (EGD) WITH PROPOFOL ;  Surgeon: Gladis RAYMOND Mariner, MD;  Location: [facility] ENDOSCOPY;  Service: Endoscopy;  Laterality: N/A;   PULMONARY THROMBECTOMY Bilateral 02/09/2024   Procedure: PULMONARY THROMBECTOMY;  Surgeon: Marea Selinda RAMAN, MD;  Location: [facility] INVASIVE CV LAB;  Service: Cardiovascular;  Laterality: Bilateral;   SKIN CANCER EXCISION     on arms    SOCIAL HISTORY: Social History   Socioeconomic  History   Marital status: Married    Spouse name: Not on file   Number of children: Not on file   Years of education: Not on file   Highest education level: Not on file  Occupational History   Not on file  Tobacco Use   Smoking status: Never   Smokeless tobacco: Never  Vaping Use   Vaping status: Never Used  Substance and Sexual Activity   Alcohol use: No   Drug use: No   Sexual activity: Not on file  Other Topics Concern   Not on file  Social History Narrative   Not on file   Social Drivers of Health   Financial Resource Strain: Low Risk  (01/21/2024)   [facility]   Overall Financial Resource Strain (CARDIA)    Difficulty of Paying Living Expenses: Not hard at all  Food Insecurity: No Food Insecurity (02/09/2024)   Hunger Vital Sign    Worried About Running Out of Food in the Last Year: Never true    Ran Out of Food in the Last Year: Never true  Transportation Needs: No Transportation Needs (02/09/2024)   PRAPARE - Administrator, Civil Service (Medical): No  Lack of Transportation (Non-Medical): No  Physical Activity: Not on file  Stress: Not on file  Social Connections: Moderately Integrated (02/09/2024)   Social Connection and Isolation Panel    Frequency of Communication with Friends and Family: Three times a week    Frequency of Social Gatherings with Friends and Family: Twice a week    Attends Religious Services: 1 to 4 times per year    Active Member of [employer] or Organizations: No    Attends Banker Meetings: Never    Marital Status: Married  Catering Manager Violence: Not At Risk (02/10/2024)   Humiliation, Afraid, Rape, and Kick questionnaire    Fear of Current or Ex-Partner: No    Emotionally Abused: No    Physically Abused: No    Sexually Abused: No    FAMILY HISTORY: Family History  Problem Relation Age of Onset   Cancer Mother    Colon cancer Neg Hx     ALLERGIES:  is allergic to latex and  sulfa antibiotics.  MEDICATIONS:  Current Outpatient Medications  Medication Sig Dispense Refill   apixaban  (ELIQUIS ) 5 MG TABS tablet Take 2 tablets (10 mg total) by mouth 2 (two) times daily for 5 days, THEN 1 tablet (5 mg total) 2 (two) times daily. 200 tablet 0   atorvastatin  (LIPITOR) 10 MG tablet Take 10 mg by mouth daily.     calcium  carbonate (OSCAL) 1500 (600 Ca) MG TABS tablet Take 600 mg of elemental calcium  by mouth 2 (two) times daily with a meal.     calcium  carbonate (TUMS - DOSED IN MG ELEMENTAL CALCIUM ) 500 MG chewable tablet Chew 1 tablet by mouth daily as needed for indigestion or heartburn.     cholecalciferol (VITAMIN D3) 25 MCG (1000 UNIT) tablet Take 1,000 Units by mouth daily.     folic acid (FOLVITE) 1 MG tablet TAKE 3 TABLETS BY MOUTH ONCE DAILY ON  YOUR  NON  METHOTREXATE  DAYS     glycerin  adult 2 g suppository Place 1 suppository rectally as needed for constipation. 12 suppository 0   [Paused] lisinopril -hydrochlorothiazide  (ZESTORETIC ) 10-12.5 MG tablet Take 1 tablet by mouth daily.     methotrexate (RHEUMATREX) 2.5 MG tablet Take 10 mg by mouth once a week.     Multiple Vitamin (MULTIVITAMIN WITH MINERALS) TABS tablet Take 1 tablet by mouth daily.     Multiple Vitamins-Minerals (ZINC PO) Take 1 capsule by mouth daily.     omeprazole (PRILOSEC) 40 MG capsule Take 40 mg by mouth daily.     polyethylene glycol (MIRALAX ) 17 g packet Take 17 g by mouth daily. 14 each 0   propranolol  (INDERAL ) 20 MG tablet Take 20 mg by mouth 2 (two) times daily.     valACYclovir (VALTREX) 500 MG tablet Take 500 mg by mouth daily as needed (FOR SHINGLES FLARE-UP).     zolpidem (AMBIEN) 10 MG tablet Take 10 mg by mouth at bedtime as needed for sleep.     [START ON 05/12/2024] apixaban  (ELIQUIS ) 5 MG TABS tablet Take 1 tablet (5 mg total) by mouth 2 (two) times daily. 180 tablet 0   No current facility-administered medications for this visit.    Review of Systems  Constitutional:   Negative for appetite change, chills, fatigue, fever and unexpected weight change.  HENT:   Negative for hearing loss and voice change.   Eyes:  Negative for eye problems and icterus.  Respiratory:  Negative for chest tightness, cough and shortness of breath.  Cardiovascular:  Negative for chest pain and leg swelling.  Gastrointestinal:  Negative for abdominal distention and abdominal pain.  Endocrine: Negative for hot flashes.  Genitourinary:  Negative for difficulty urinating, dysuria and frequency.   Musculoskeletal:  Negative for arthralgias.  Skin:  Negative for itching and rash.  Neurological:  Negative for light-headedness and numbness.  Hematological:  Negative for adenopathy. Does not bruise/bleed easily.  Psychiatric/Behavioral:  Negative for confusion.    PHYSICAL EXAMINATION:  Vitals:   03/01/24 1104  BP: 124/71  Pulse: 66  Resp: 18  Temp: 97.8 F (36.6 C)  SpO2: 99%   Filed Weights   03/01/24 1104  Weight: 209 lb 12.8 oz (95.2 kg)    Physical Exam Constitutional:      General: He is not in acute distress. HENT:     Head: Normocephalic and atraumatic.  Eyes:     General: No scleral icterus. Cardiovascular:     Rate and Rhythm: Normal rate and regular rhythm.  Pulmonary:     Effort: Pulmonary effort is normal. No respiratory distress.     Breath sounds: Normal breath sounds. No wheezing.  Abdominal:     General: Bowel sounds are normal. There is no distension.     Palpations: Abdomen is soft.  Musculoskeletal:        General: No deformity. Normal range of motion.     Cervical back: Normal range of motion and neck supple.  Skin:    General: Skin is warm and dry.     Findings: No erythema or rash.  Neurological:     Mental Status: He is alert and oriented to person, place, and time. Mental status is at baseline.  Psychiatric:        Mood and Affect: Mood normal.     LABORATORY DATA:  I have reviewed the data as listed    Latest Ref Rng & Units  02/11/2024    5:00 AM 02/10/2024    2:08 AM 02/09/2024   11:18 AM  CBC  WBC 4.0 - 10.5 K/uL 9.2  14.0  7.7   Hemoglobin 13.0 - 17.0 g/dL 88.5  88.1  86.8   Hematocrit 39.0 - 52.0 % 34.7  36.9  41.3   Platelets 150 - 400 K/uL 183  158  194       Latest Ref Rng & Units 02/11/2024    5:00 AM 02/10/2024    4:35 AM 02/09/2024   11:18 AM  CMP  Glucose 70 - 99 mg/dL 886  876  795   BUN 8 - 23 mg/dL 23  27  21    Creatinine 0.61 - 1.24 mg/dL 8.85  8.80  8.91   Sodium 135 - 145 mmol/L 138  138  139   Potassium 3.5 - 5.1 mmol/L 4.3  4.1  4.4   Chloride 98 - 111 mmol/L 106  109  109   CO2 22 - 32 mmol/L 24  22  20    Calcium  8.9 - 10.3 mg/dL 8.1  8.4  8.4   Total Protein 6.5 - 8.1 g/dL   6.2   Total Bilirubin 0.0 - 1.2 mg/dL   0.5   Alkaline Phos 38 - 126 U/L   50   AST 15 - 41 U/L   26   ALT 0 - 44 U/L   24       RADIOGRAPHIC STUDIES: I have personally reviewed the radiological images as listed and agreed with the findings in the report. ECHOCARDIOGRAM COMPLETE Result Date: 02/09/2024  ECHOCARDIOGRAM REPORT   Patient Name:   Braylee Bosher. Date of Exam: 02/09/2024 Medical Rec #:  [id number]        Height:       73.0 in Accession #:    [id number]       Weight:       213.4 lb Date of Birth:  24-Jan-1941        BSA:          2.212 m Patient Age:    82 years         BP:           100/77 mmHg Patient Gender: M                HR:           92 bpm. Exam Location:  [facility] Procedure: 2D Echo, Color Doppler, Cardiac Doppler and Intracardiac            Opacification Agent (Both Spectral and Color Flow Doppler were            utilized during procedure). Indications:     Pulmonary Embolus I26.09  History:         Patient has no prior history of Echocardiogram examinations.                  Pulmonary Embolism.  Sonographer:     Ashley McNeely-Sloane Referring Phys:  [id number] SELINDA RAMAN DEW Diagnosing Phys: Evalene Lunger MD IMPRESSIONS  1. Left ventricular ejection fraction, by estimation, is 60 to 65%. The left  ventricle has normal function. The left ventricle has no regional wall motion abnormalities. There is mild left ventricular hypertrophy. Left ventricular diastolic parameters are consistent with Grade I diastolic dysfunction (impaired relaxation). There is the interventricular septum is flattened in systole and diastole, consistent with right ventricular pressure and volume overload.  2. Right ventricular systolic function is moderately reduced. The right ventricular size is severely enlarged. There is moderately elevated pulmonary artery systolic pressure. The estimated right ventricular systolic pressure is 48.6 mmHg.  3. The mitral valve is normal in structure. Mild mitral valve regurgitation. No evidence of mitral stenosis.  4. Tricuspid valve regurgitation is mild to moderate.  5. The aortic valve is normal in structure. Aortic valve regurgitation is not visualized. No aortic stenosis is present.  6. The inferior vena cava is normal in size with greater than 50% respiratory variability, suggesting right atrial pressure of 3 mmHg. FINDINGS  Left Ventricle: Left ventricular ejection fraction, by estimation, is 60 to 65%. The left ventricle has normal function. The left ventricle has no regional wall motion abnormalities. Definity  contrast agent was given IV to delineate the left ventricular  endocardial borders. Strain was performed and the global longitudinal strain is indeterminate. The left ventricular internal cavity size was normal in size. There is mild left ventricular hypertrophy. The interventricular septum is flattened in systole and diastole, consistent with right ventricular pressure and volume overload. Left ventricular diastolic parameters are consistent with Grade I diastolic dysfunction (impaired relaxation). Right Ventricle: The right ventricular size is severely enlarged. No increase in right ventricular wall thickness. Right ventricular systolic function is moderately reduced. There is  moderately elevated pulmonary artery systolic pressure. The tricuspid regurgitant velocity is 3.30 m/s, and with an assumed right atrial pressure of 5 mmHg, the estimated right ventricular systolic pressure is 48.6 mmHg. Left Atrium: Left atrial size was normal in size. Right Atrium: Right atrial size was normal in size. Pericardium: There  is no evidence of pericardial effusion. Mitral Valve: The mitral valve is normal in structure. Mild mitral valve regurgitation. No evidence of mitral valve stenosis. Tricuspid Valve: The tricuspid valve is normal in structure. Tricuspid valve regurgitation is mild to moderate. No evidence of tricuspid stenosis. Aortic Valve: The aortic valve is normal in structure. Aortic valve regurgitation is not visualized. No aortic stenosis is present. Aortic valve mean gradient measures 2.0 mmHg. Aortic valve peak gradient measures 3.1 mmHg. Aortic valve area, by VTI measures 2.62 cm. Pulmonic Valve: The pulmonic valve was normal in structure. Pulmonic valve regurgitation is not visualized. No evidence of pulmonic stenosis. Aorta: The aortic root is normal in size and structure. Venous: The inferior vena cava is normal in size with greater than 50% respiratory variability, suggesting right atrial pressure of 3 mmHg. IAS/Shunts: No atrial level shunt detected by color flow Doppler. Additional Comments: 3D was performed not requiring image post processing on an independent workstation and was indeterminate.  LEFT VENTRICLE PLAX 2D LVIDd:         4.10 cm     Diastology LVIDs:         2.30 cm     LV e' medial:    5.55 cm/s LV PW:         1.50 cm     LV E/e' medial:  7.7 LV IVS:        0.90 cm     LV e' lateral:   8.81 cm/s LVOT diam:     1.90 cm     LV E/e' lateral: 4.9 LV SV:         38 LV SV Index:   17 LVOT Area:     2.84 cm  LV Volumes (MOD) LV vol d, MOD A2C: 39.6 ml LV vol d, MOD A4C: 20.5 ml LV vol s, MOD A2C: 10.0 ml LV vol s, MOD A4C: 9.7 ml LV SV MOD A2C:     29.6 ml LV SV MOD A4C:      20.5 ml LV SV MOD BP:      19.3 ml RIGHT VENTRICLE RV Basal diam:  5.10 cm RV Mid diam:    4.30 cm RV S prime:     6.42 cm/s TAPSE (M-mode): 0.8 cm LEFT ATRIUM             Index        RIGHT ATRIUM           Index LA diam:        2.60 cm 1.18 cm/m   RA Area:     13.50 cm LA Vol (A2C):   60.4 ml 27.31 ml/m  RA Volume:   31.40 ml  14.20 ml/m LA Vol (A4C):   16.6 ml 7.51 ml/m LA Biplane Vol: 34.3 ml 15.51 ml/m  AORTIC VALVE AV Area (Vmax):    2.83 cm AV Area (Vmean):   2.62 cm AV Area (VTI):     2.62 cm AV Vmax:           87.80 cm/s AV Vmean:          60.100 cm/s AV VTI:            0.145 m AV Peak Grad:      3.1 mmHg AV Mean Grad:      2.0 mmHg LVOT Vmax:         87.50 cm/s LVOT Vmean:        55.500 cm/s LVOT VTI:  0.134 m LVOT/AV VTI ratio: 0.92  AORTA Ao Root diam: 3.50 cm Ao Asc diam:  3.50 cm MITRAL VALVE               TRICUSPID VALVE MV Area (PHT): 4.49 cm    TR Peak grad:   43.6 mmHg MV Decel Time: 169 msec    TR Mean grad:   26.0 mmHg MV E velocity: 43.00 cm/s  TR Vmax:        330.00 cm/s MV A velocity: 91.70 cm/s  TR Vmean:       237.0 cm/s MV E/A ratio:  0.47                            SHUNTS                            Systemic VTI:  0.13 m                            Systemic Diam: 1.90 cm Evalene Lunger MD Electronically signed by Evalene Lunger MD Signature Date/Time: 02/09/2024/3:41:49 PM    Final    DG Chest Port 1 View Result Date: 02/09/2024 CLINICAL DATA:  [id number] Acute hypoxic respiratory failure (HCC) [id number] EXAM: PORTABLE CHEST - 1 VIEW COMPARISON:  February 08, 2024 FINDINGS: No focal airspace consolidation, large pleural effusion, or pneumothorax. Mild cardiomegaly. Tortuous aorta with aortic atherosclerosis. No acute fracture or destructive lesions. Multilevel thoracic osteophytosis. IMPRESSION: Cardiomegaly.  No pneumonia or pulmonary edema. Electronically Signed   By: Rogelia Myers M.D.   On: 02/09/2024 10:57   PERIPHERAL VASCULAR CATHETERIZATION Result Date:  02/09/2024 See surgical note for result.  CT Angio Chest PE W and/or Wo Contrast Addendum Date: 02/09/2024 ADDENDUM REPORT: 02/09/2024 05:08 ADDENDUM: Critical Value/emergent results were called by telephone at the time of interpretation on 02/09/2024 at 1:42 am to provider [facility] , who verbally acknowledged these results. Electronically Signed   By: Francis Quam M.D.   On: 02/09/2024 05:08   Result Date: 02/09/2024 CLINICAL DATA:  Positive D-dimer, weakness, and shortness of breath. Hypoxia on EMS arrival. EXAM: CT ANGIOGRAPHY CHEST WITH CONTRAST TECHNIQUE: Multidetector CT imaging of the chest was performed using the standard protocol during bolus administration of intravenous contrast. Multiplanar CT image reconstructions and MIPs were obtained to evaluate the vascular anatomy. RADIATION DOSE REDUCTION: This exam was performed according to the departmental dose-optimization program which includes automated exposure control, adjustment of the mA and/or kV according to patient size and/or use of iterative reconstruction technique. CONTRAST:  75mL OMNIPAQUE  IOHEXOL  350 MG/ML SOLN COMPARISON:  CT abdomen pelvis with IV contrast 01/05/2024, portable chest today, PA Lat chest 10/04/2009. No prior chest CT or CTA. FINDINGS: Cardiovascular: Large volume of bilateral upper and lower lobe arterial embolic disease with right heart strain. There is mild cardiomegaly with a right chamber predominance, IVC and hepatic vein reflux, and bowing of the interventricular septum to the left, all in keeping with right heart strain. There is a band of thrombus occupying the right atrium, a prominent pulmonary trunk measuring 3.2 cm, linear thrombus in the right main pulmonary artery, and nearly occlusive thrombus in both distal main pulmonary arteries. There are near occlusive emboli in both upper and lower lobe main arteries and the interlobar pulmonary artery, near occlusive segmental emboli in the right middle lobe, and  a combination of  occlusive and near occlusive multifocal segmental and subsegmental emboli in the upper and lower lobes. There is a small pericardial effusion new from 01/05/2024. the pulmonary veins are normal caliber. The aorta is tortuous with trace calcific plaques in the descending segment. There is no aneurysm or dissection. The great vessels are unremarkable. Mediastinum/Nodes: No enlarged mediastinal, hilar, or axillary lymph nodes. Thyroid gland, trachea, and esophagus demonstrate no significant findings. Lungs/Pleura: There is a new pleural-based opacity posteriorly in the right lower lobe which is probably a developing infarct. There is a 6 mm right lower lobe nodule in the superior segment on 5:76 which is above the plane of all prior abdomen and pelvis CTs. Remaining bilateral lungs are essentially clear. There are trace pleural effusions. There is chronic linear scarring in the base of the lingula and base of the right middle lobe. Upper Abdomen: No acute abnormality. Abdominal aortic atherosclerosis. Musculoskeletal: There is degenerative change and kyphosis of the thoracic spine, slight dextroscoliosis. No acute or significant osseous abnormality. Unremarkable chest wall. Review of the MIP images confirms the above findings. IMPRESSION: 1. Large volume of bilateral upper and lower lobe arterial embolic disease with right heart strain. CT evidence of right heart strain (RV/LV Ratio = 1.37) consistent with at least submassive (intermediate risk) PE. The presence of right heart strain has been associated with an increased risk of morbidity and mortality. 2. Right atrial thrombus, prominent pulmonary trunk, and linear thrombus in the right main pulmonary artery. 3. Near occlusive emboli in both upper and lower lobe main arteries and the interlobar pulmonary artery, near occlusive segmental emboli in the right middle lobe, and a combination of occlusive and near occlusive multifocal segmental and  subsegmental emboli in the upper and lower lobes. 4. New pleural-based opacity posteriorly in the right lower lobe which is probably a developing infarct. 5. 6 mm right lower lobe nodule. Non-contrast chest CT at 6-12 months is recommended. If the nodule is stable at time of repeat CT, then future CT at 18-24 months (from today's scan) is considered optional for low-risk patients, but is recommended for high-risk patients. This recommendation follows the consensus statement: Guidelines for Management of Incidental Pulmonary Nodules Detected on CT Images: From the Fleischner Society 2017; Radiology 2017; 284:228-243. 6. Trace pleural effusions. 7. Aortic atherosclerosis. 8. PRA is attempting to reach the ordering physician for stat notification at the time of signing. Aortic Atherosclerosis (ICD10-I70.0). Electronically Signed: By: Francis Quam M.D. On: 02/09/2024 01:36   US  Venous Img Lower Bilateral (DVT) Result Date: 02/09/2024 CLINICAL DATA:  Large volume bilateral pulmonary embolic burden. Assess for DVT. [id number]. EXAM: BILATERAL LOWER EXTREMITY VENOUS DOPPLER ULTRASOUND TECHNIQUE: Gray-scale sonography with graded compression, as well as color Doppler and duplex ultrasound were performed to evaluate the lower extremity deep venous systems from the level of the common femoral vein and including the common femoral, femoral, profunda femoral, popliteal and calf veins including the posterior tibial, peroneal and gastrocnemius veins when visible. The superficial great saphenous vein was also interrogated. Spectral Doppler was utilized to evaluate flow at rest and with distal augmentation maneuvers in the common femoral, femoral and popliteal veins. COMPARISON:  None Available. FINDINGS: RIGHT LOWER EXTREMITY Common Femoral Vein: No evidence of thrombus. Normal compressibility, respiratory phasicity and response to augmentation. Saphenofemoral Junction: No evidence of thrombus. Normal compressibility and flow on  color Doppler imaging. Profunda Femoral Vein: No evidence of thrombus. Normal compressibility and flow on color Doppler imaging. Femoral Vein: No evidence of thrombus. Normal compressibility, respiratory  phasicity and response to augmentation. Popliteal Vein: No evidence of thrombus. Normal compressibility, respiratory phasicity and response to augmentation. Calf Veins: No evidence of thrombus. Normal compressibility and flow on color Doppler imaging. Superficial Great Saphenous Vein: No evidence of thrombus. Normal compressibility. Venous Reflux:  None. Other Findings:  None. LEFT LOWER EXTREMITY Common Femoral Vein: No evidence of thrombus. Normal compressibility, respiratory phasicity and response to augmentation. Saphenofemoral Junction: No evidence of thrombus. Normal compressibility and flow on color Doppler imaging. Profunda Femoral Vein: No evidence of thrombus. Normal compressibility and flow on color Doppler imaging. Femoral Vein: No evidence of thrombus. Normal compressibility, respiratory phasicity and response to augmentation. Popliteal Vein: No evidence of thrombus. Normal compressibility, respiratory phasicity and response to augmentation. Calf Veins: No evidence of thrombus. Normal compressibility and flow on color Doppler imaging. Superficial Great Saphenous Vein: No evidence of thrombus. Normal compressibility. Venous Reflux:  None. Other Findings:  None. IMPRESSION: No evidence of deep venous thrombosis in either lower extremity. Electronically Signed   By: Francis Quam M.D.   On: 02/09/2024 05:07   DG Chest 1 View Result Date: 02/09/2024 CLINICAL DATA:  Shortness of breath and fall EXAM: CHEST  1 VIEW COMPARISON:  10/04/2009 FINDINGS: Stable cardiomediastinal silhouette. Bibasilar atelectasis. The lungs are otherwise clear. No pleural effusion or pneumothorax. No displaced rib fracture. IMPRESSION: No active disease. Electronically Signed   By: Norman Gatlin M.D.   On: 02/09/2024 00:05    CT ABDOMEN PELVIS W CONTRAST Result Date: 01/06/2024 EXAM: CT ABDOMEN AND PELVIS WITH CONTRAST 01/06/2024 12:00:37 AM TECHNIQUE: CT of the abdomen and pelvis was performed with the administration of iohexol  (OMNIPAQUE ) 300 MG/ML solution. Multiplanar reformatted images are provided for review. Automated exposure control, iterative reconstruction, and/or weight based adjustment of the mA/kV was utilized to reduce the radiation dose to as low as reasonably achievable. COMPARISON: CT abdomen and pelvis dated 05/28/2023 and x-ray dated 01/05/2024. CLINICAL HISTORY: Constipation x 3 weeks, eval for obstruction, other pathology. Per ED notes; Arrives via [facility]. C/O abd pain x 3 weeks. Concerned he is impacted. Last good BM 3 weeks ago, now just moving small 'pebbles'. FINDINGS: LOWER CHEST: No acute abnormality. LIVER: The liver is unremarkable. GALLBLADDER AND BILE DUCTS: Gallbladder is unremarkable. No biliary ductal dilatation. SPLEEN: No acute abnormality. PANCREAS: No acute abnormality. ADRENAL GLANDS: No acute abnormality. KIDNEYS, URETERS AND BLADDER: No stones in the kidneys or ureters. No hydronephrosis. No perinephric or periureteral stranding. Urinary bladder is unremarkable. GI AND BOWEL: Large colonic stool burden throughout the colon. Large stool ball in the rectum with mild rectal wall thickening and trace adjacent stranding. Normal appendix. PERITONEUM AND RETROPERITONEUM: No ascites. No free air. VASCULATURE: Aorta is normal in caliber. LYMPH NODES: No lymphadenopathy. REPRODUCTIVE ORGANS: No acute abnormality. BONES AND SOFT TISSUES: No acute osseous abnormality. No focal soft tissue abnormality. IMPRESSION: 1. Constipation with large stool burden throughout the colon including a large stool ball in the rectum where there is wall thickening and adjacent stranding. Correlate for fecal impaction and developing stercoral colitis. Electronically signed by: Norman Gatlin MD  01/06/2024 12:09 AM EDT RP Workstation: [id number]   DG Abd 1 View Result Date: 01/05/2024 CLINICAL DATA:  Constipation EXAM: ABDOMEN - 1 VIEW COMPARISON:  Abdominal pain 05/10/2023 FINDINGS: The bowel gas pattern is normal. There is a large amount of stool throughout the entire colon and within the rectum. No radio-opaque calculi or other significant radiographic abnormality are seen. IMPRESSION: Large amount of stool throughout the entire  colon and within the rectum. Electronically Signed   By: Greig Pique M.D.   On: 01/05/2024 17:59

## 2024-03-01 NOTE — Assessment & Plan Note (Signed)
 6mm RLL nodule, repeat CT in 6 -12 months.

## 2024-03-14 ENCOUNTER — Other Ambulatory Visit: Payer: Self-pay

## 2024-03-14 MED ORDER — APIXABAN 5 MG PO TABS
5.0000 mg | ORAL_TABLET | Freq: Two times a day (BID) | ORAL | 0 refills | Status: AC
Start: 2024-03-01 — End: ?

## 2024-04-27 ENCOUNTER — Ambulatory Visit: Admit: 2024-04-27 | Discharge: 2024-04-28 | Payer: Medicare (Managed Care)

## 2024-04-27 DIAGNOSIS — Z79899 Other long term (current) drug therapy: Principal | ICD-10-CM

## 2024-04-27 DIAGNOSIS — L111 Transient acantholytic dermatosis [Grover]: Principal | ICD-10-CM

## 2024-04-27 DIAGNOSIS — B001 Herpesviral vesicular dermatitis: Principal | ICD-10-CM

## 2024-04-27 DIAGNOSIS — R21 Rash and other nonspecific skin eruption: Principal | ICD-10-CM

## 2024-04-27 DIAGNOSIS — L570 Actinic keratosis: Principal | ICD-10-CM

## 2024-04-27 MED ORDER — FOLIC ACID 1 MG TABLET
ORAL_TABLET | 3 refills | 0.00000 days | Status: CP
Start: 2024-04-27 — End: ?

## 2024-04-27 MED ORDER — METHOTREXATE SODIUM 2.5 MG TABLET
ORAL_TABLET | 1 refills | 0.00000 days | Status: CP
Start: 2024-04-27 — End: ?

## 2024-04-27 MED ORDER — VALACYCLOVIR 500 MG TABLET
ORAL_TABLET | ORAL | 0 refills | 0.00000 days | Status: CP
Start: 2024-04-27 — End: ?

## 2024-05-31 ENCOUNTER — Inpatient Hospital Stay: Attending: Oncology

## 2024-05-31 DIAGNOSIS — Z79899 Other long term (current) drug therapy: Secondary | ICD-10-CM | POA: Insufficient documentation

## 2024-05-31 DIAGNOSIS — Z7901 Long term (current) use of anticoagulants: Secondary | ICD-10-CM | POA: Diagnosis not present

## 2024-05-31 DIAGNOSIS — I2609 Other pulmonary embolism with acute cor pulmonale: Secondary | ICD-10-CM | POA: Insufficient documentation

## 2024-05-31 LAB — COMPREHENSIVE METABOLIC PANEL WITH GFR
ALT: 12 U/L (ref 0–44)
AST: 15 U/L (ref 15–41)
Albumin: 4.2 g/dL (ref 3.5–5.0)
Alkaline Phosphatase: 53 U/L (ref 38–126)
Anion gap: 10 (ref 5–15)
BUN: 24 mg/dL — ABNORMAL HIGH (ref 8–23)
CO2: 27 mmol/L (ref 22–32)
Calcium: 9.4 mg/dL (ref 8.9–10.3)
Chloride: 103 mmol/L (ref 98–111)
Creatinine, Ser: 1.27 mg/dL — ABNORMAL HIGH (ref 0.61–1.24)
GFR, Estimated: 56 mL/min — ABNORMAL LOW (ref >60)
Glucose, Bld: 95 mg/dL (ref 70–99)
Potassium: 4 mmol/L (ref 3.5–5.1)
Sodium: 140 mmol/L (ref 135–145)
Total Bilirubin: 0.5 mg/dL (ref 0.0–1.2)
Total Protein: 6.9 g/dL (ref 6.5–8.1)

## 2024-05-31 LAB — CBC WITH DIFFERENTIAL/PLATELET
Abs Immature Granulocytes: 0.02 K/uL (ref 0.00–0.07)
Basophils Absolute: 0.1 K/uL (ref 0.0–0.1)
Basophils Relative: 1 %
Eosinophils Absolute: 0.1 K/uL (ref 0.0–0.5)
Eosinophils Relative: 1 %
HCT: 44.2 % (ref 39.0–52.0)
Hemoglobin: 14.3 g/dL (ref 13.0–17.0)
Immature Granulocytes: 0 %
Lymphocytes Relative: 34 %
Lymphs Abs: 2.5 K/uL (ref 0.7–4.0)
MCH: 31.6 pg (ref 26.0–34.0)
MCHC: 32.4 g/dL (ref 30.0–36.0)
MCV: 97.6 fL (ref 80.0–100.0)
Monocytes Absolute: 0.7 K/uL (ref 0.1–1.0)
Monocytes Relative: 10 %
Neutro Abs: 3.9 K/uL (ref 1.7–7.7)
Neutrophils Relative %: 54 %
Platelets: 223 K/uL (ref 150–400)
RBC: 4.53 MIL/uL (ref 4.22–5.81)
RDW: 15.1 % (ref 11.5–15.5)
WBC: 7.3 K/uL (ref 4.0–10.5)
nRBC: 0 % (ref 0.0–0.2)

## 2024-05-31 LAB — ANTITHROMBIN III: AntiThromb III Func: 88 % (ref 75–120)

## 2024-05-31 LAB — D-DIMER, QUANTITATIVE: D-Dimer, Quant: 0.34 ug{FEU}/mL (ref 0.00–0.50)

## 2024-06-01 LAB — PROTEIN C ACTIVITY: Protein C Activity: 91 % (ref 73–180)

## 2024-06-01 MED ORDER — METHOTREXATE SODIUM 2.5 MG TABLET
ORAL_TABLET | 0 refills | 0.00000 days
Start: 2024-06-01 — End: ?

## 2024-06-04 MED ORDER — METHOTREXATE SODIUM 2.5 MG TABLET
ORAL_TABLET | 1 refills | 0.00000 days | Status: CP
Start: 2024-06-04 — End: ?

## 2024-06-07 ENCOUNTER — Inpatient Hospital Stay: Admitting: Oncology

## 2024-06-07 ENCOUNTER — Encounter: Payer: Self-pay | Admitting: Oncology

## 2024-06-07 ENCOUNTER — Other Ambulatory Visit: Payer: Self-pay

## 2024-06-07 VITALS — BP 132/72 | HR 70 | Temp 98.6°F | Resp 18 | Wt 221.6 lb

## 2024-06-07 DIAGNOSIS — I2609 Other pulmonary embolism with acute cor pulmonale: Secondary | ICD-10-CM | POA: Diagnosis not present

## 2024-06-07 DIAGNOSIS — R911 Solitary pulmonary nodule: Secondary | ICD-10-CM | POA: Diagnosis not present

## 2024-06-07 MED ORDER — APIXABAN 5 MG PO TABS
5.0000 mg | ORAL_TABLET | Freq: Two times a day (BID) | ORAL | 1 refills | Status: AC
  Filled 2024-06-07: qty 180, 90d supply, fill #0

## 2024-06-07 NOTE — Progress Notes (Signed)
 Hematology/Oncology Consult note [phone] [phone]        REFERRING PROVIDER: [name], MD   CHIEF COMPLAINTS/REASON FOR VISIT:  Evaluation of bilateral pulmonary embolism.    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  Continue anticoagulation Eliquis  5mg  BID for 6 months [till end of Feb/early March]. I plan to decrease to lower prophylactic dosage at next visit.   Hypercoagulable work up  Negative cadiolipin abs, beta 2 glycoprotein abs, no lupus anticoagulant,  negative prothrombin gene and factor 5 leiden mutation. Normal protein S Ag and activity, normal protein C Ag, decreased protein C activity 71% decreased antithrombin III   05/31/2024  repeat protein C activity normal, and antithrombin III  level  normal.    Lung nodule 6mm RLL nodule, repeat CT in 6 months    Orders Placed This Encounter  Procedures   CT Chest Wo Contrast    Standing Status:   Future    Expected Date:   08/23/2024    Expiration Date:   06/07/2025    Preferred imaging location?:   [facility]   Protein C, total    Standing Status:   Future    Expected Date:   08/21/2024    Expiration Date:   06/07/2025   CBC with Differential (Cancer Center Only)    Standing Status:   Future    Expected Date:   08/22/2024    Expiration Date:   11/20/2024   CMP (Cancer Center only)    Standing Status:   Future    Expected Date:   08/22/2024    Expiration Date:   11/20/2024   Follow up in 3 months  All questions were answered. The patient knows to call the clinic with any problems, questions or concerns.  Zelphia Cap, MD, PhD [facility] Hematology Oncology 06/07/2024   HISTORY OF PRESENTING ILLNESS:   Steve Rhodes. is a  83 y.o.  male with PMH listed below was seen in consultation at the request of  [name], MD  for evaluation of bilateral pulmonary embolism.   Discussed the use of AI scribe software for clinical note  transcription with the patient, who gave verbal consent to proceed.   He was recently hospitalized for acute shortness of breath that began a few days prior to his emergency room visit. He experienced an episode of severe dyspnea after using the bathroom and sitting in his lounge chair, prompting his wife to call 911. The patient underwent emergency surgery, CTA showed Large volume of bilateral PE, underwent mechanical thrombectomy.   There are no known triggers for the pulmonary embolism, such as recent immobilization, long travel, or injury. He had been hospitalized in July for constipation, during which he underwent an endoscopy and colonoscopy. The colonoscopy prep was inadequate, and he was treated with Miralax  and Metamucil. There is uncertainty if this prior hospitalization could have contributed to the current clotting event.  Echo EF 60-65%, G1 Diastolic dysfunction. IVS flattened consistent with right ventricular pressure and volume overload   He is currently on Eliquis ,5mg  BID.  SOB has improved. No chest pain. He experiences dizziness upon standing and occasional light chest pain, which he did not have prior to this event. He uses a cane for stability.  His family history includes his mother having cancer, for which she received chemotherapy. He denies any personal or family history of blood clots. He has never smoked, although he was exposed to secondhand smoke  from his parents.  His hypercoagulable work up showed  Negative cadiolipin abs, beta 2 glycoprotein abs, no lupus anticoagulant,  negative prothrombin gene and factor 5 leiden mutation. Normal protein S Ag and activity, normal protein C Ag, decreased protein C activity 71% decreased antithrombin III     INTERVAL HISTORY Steve Rhodes. is a 83 y.o. male who has above history reviewed by me today presents for follow up visit for history of acute PE.  He tolerates Elquis 5mg  BID.  No bleeding events.  He denies SOB, chest pain  or leg edema.    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/2015   TUBULAR ADENOMA   GERD (gastroesophageal reflux disease)    Grover's disease    Hyperlipidemia    Hypertension    Mitral valve disorder     SURGICAL HISTORY: Past Surgical History:  Procedure Laterality Date   CARDIAC CATHETERIZATION Left 01/28/2016   Procedure: Left Heart Cath and Coronary Angiography;  Surgeon: Vinie DELENA Jude, MD;  Location: [facility] INVASIVE CV LAB;  Service: Cardiovascular;  Laterality: Left;   COLONOSCOPY N/A 01/31/2024   Procedure: COLONOSCOPY;  Surgeon: Onita Elspeth Sharper, DO;  Location: [facility] ENDOSCOPY;  Service: Gastroenterology;  Laterality: N/A;   COLONOSCOPY WITH PROPOFOL  N/A 05/10/2015   Procedure: COLONOSCOPY WITH PROPOFOL ;  Surgeon: Deward CINDERELLA Piedmont, MD;  Location: [facility] ENDOSCOPY;  Service: Gastroenterology;  Laterality: N/A;   ESOPHAGOGASTRODUODENOSCOPY N/A 01/31/2024   Procedure: EGD (ESOPHAGOGASTRODUODENOSCOPY);  Surgeon: Onita Elspeth Sharper, DO;  Location: [facility] ENDOSCOPY;  Service: Gastroenterology;  Laterality: N/A;   ESOPHAGOGASTRODUODENOSCOPY (EGD) WITH PROPOFOL  N/A 05/10/2015   Procedure: ESOPHAGOGASTRODUODENOSCOPY (EGD) WITH PROPOFOL ;  Surgeon: Deward CINDERELLA Piedmont, MD;  Location: [facility] ENDOSCOPY;  Service: Gastroenterology;  Laterality: N/A;   ESOPHAGOGASTRODUODENOSCOPY (EGD) WITH PROPOFOL  N/A 06/10/2015   Procedure: ESOPHAGOGASTRODUODENOSCOPY (EGD) WITH PROPOFOL ;  Surgeon: Deward CINDERELLA Piedmont, MD;  Location: [facility] ENDOSCOPY;  Service: Gastroenterology;  Laterality: N/A;   ESOPHAGOGASTRODUODENOSCOPY (EGD) WITH PROPOFOL  N/A 06/29/2016   Procedure: ESOPHAGOGASTRODUODENOSCOPY (EGD) WITH PROPOFOL ;  Surgeon: Gladis RAYMOND Mariner, MD;  Location: [facility] ENDOSCOPY;  Service: Endoscopy;  Laterality: N/A;   PULMONARY THROMBECTOMY Bilateral 02/09/2024   Procedure: PULMONARY THROMBECTOMY;  Surgeon: Marea Selinda RAMAN, MD;  Location: [facility] INVASIVE  CV LAB;  Service: Cardiovascular;  Laterality: Bilateral;   SKIN CANCER EXCISION     on arms    SOCIAL HISTORY: Social History   Socioeconomic History   Marital status: Married    Spouse name: Not on file   Number of children: Not on file   Years of education: Not on file   Highest education level: Not on file  Occupational History   Not on file  Tobacco Use   Smoking status: Never   Smokeless tobacco: Never  Vaping Use   Vaping status: Never Used  Substance and Sexual Activity   Alcohol use: No   Drug use: No   Sexual activity: Not on file  Other Topics Concern   Not on file  Social History Narrative   Not on file   Social Drivers of Health   Tobacco Use: Low Risk (06/07/2024)   Patient History    Smoking Tobacco Use: Never    Smokeless Tobacco Use: Never    Passive Exposure: Not on file  Financial Resource Strain: Low Risk (03/01/2024)   Overall Financial Resource Strain (CARDIA)    Difficulty of Paying Living Expenses: Not very  hard  Food Insecurity: No Food Insecurity (03/01/2024)   Epic    Worried About Programme Researcher, Broadcasting/film/video in the Last Year: Never true    Ran Out of Food in the Last Year: Never true  Transportation Needs: No Transportation Needs (03/01/2024)   Epic    Lack of Transportation (Medical): No    Lack of Transportation (Non-Medical): No  Physical Activity: Not on file  Stress: No Stress Concern Present (03/01/2024)   [employer] of Occupational Health - Occupational Stress Questionnaire    Feeling of Stress: Not at all  Social Connections: Moderately Integrated (02/09/2024)   Social Connection and Isolation Panel    Frequency of Communication with Friends and Family: Three times a week    Frequency of Social Gatherings with Friends and Family: Twice a week    Attends Religious Services: 1 to 4 times per year    Active Member of [employer] or Organizations: No    Attends Banker Meetings: Never    Marital Status: Married  Careers Information Officer Violence: Not At Risk (03/01/2024)   Epic    Fear of Current or Ex-Partner: No    Emotionally Abused: No    Physically Abused: No    Sexually Abused: No  Depression (PHQ2-9): Low Risk (03/01/2024)   Depression (PHQ2-9)    PHQ-2 Score: 0  Alcohol Screen: Not on file  Housing: Low Risk (03/01/2024)   Epic    Unable to Pay for Housing in the Last Year: No    Number of Times Moved in the Last Year: 0    Homeless in the Last Year: No  Utilities: Not At Risk (03/01/2024)   Epic    Threatened with loss of utilities: No  Health Literacy: Adequate Health Literacy (03/01/2024)   B1300 Health Literacy    Frequency of need for help with medical instructions: Never    FAMILY HISTORY: Family History  Problem Relation Age of Onset   Cancer Mother    Colon cancer Neg Hx     ALLERGIES:  is allergic to latex and sulfa antibiotics.  MEDICATIONS:  Current Outpatient Medications  Medication Sig Dispense Refill   atorvastatin  (LIPITOR) 10 MG tablet Take 10 mg by mouth daily.     calcium  carbonate (OSCAL) 1500 (600 Ca) MG TABS tablet Take 600 mg of elemental calcium  by mouth 2 (two) times daily with a meal.     calcium  carbonate (TUMS - DOSED IN MG ELEMENTAL CALCIUM ) 500 MG chewable tablet Chew 1 tablet by mouth daily as needed for indigestion or heartburn.     cholecalciferol (VITAMIN D3) 25 MCG (1000 UNIT) tablet Take 1,000 Units by mouth daily.     folic acid (FOLVITE) 1 MG tablet TAKE 3 TABLETS BY MOUTH ONCE DAILY ON  YOUR  NON  METHOTREXATE  DAYS     lisinopril -hydrochlorothiazide  (ZESTORETIC ) 10-12.5 MG tablet Take 1 tablet by mouth daily.     methotrexate (RHEUMATREX) 2.5 MG tablet Take 10 mg by mouth once a week.     Multiple Vitamin (MULTIVITAMIN WITH MINERALS) TABS tablet Take 1 tablet by mouth daily.     Multiple Vitamins-Minerals (ZINC PO) Take 1 capsule by mouth daily.     omeprazole (PRILOSEC) 40 MG capsule Take 40 mg by mouth daily.     propranolol  (INDERAL ) 20 MG tablet Take  20 mg by mouth 2 (two) times daily.     valACYclovir (VALTREX) 500 MG tablet Take 500 mg by mouth daily as needed (FOR SHINGLES FLARE-UP).  apixaban  (ELIQUIS ) 5 MG TABS tablet Take 1 tablet (5 mg total) by mouth 2 (two) times daily. 180 tablet 1   glycerin  adult 2 g suppository Place 1 suppository rectally as needed for constipation. (Patient not taking: Reported on 06/07/2024) 12 suppository 0   polyethylene glycol (MIRALAX ) 17 g packet Take 17 g by mouth daily. (Patient not taking: Reported on 06/07/2024) 14 each 0   zolpidem (AMBIEN) 10 MG tablet Take 10 mg by mouth at bedtime as needed for sleep. (Patient not taking: Reported on 06/07/2024)     No current facility-administered medications for this visit.    Review of Systems  Constitutional:  Negative for appetite change, chills, fatigue, fever and unexpected weight change.  HENT:   Negative for hearing loss and voice change.   Eyes:  Negative for eye problems and icterus.  Respiratory:  Negative for chest tightness, cough and shortness of breath.   Cardiovascular:  Negative for chest pain and leg swelling.  Gastrointestinal:  Negative for abdominal distention and abdominal pain.  Endocrine: Negative for hot flashes.  Genitourinary:  Negative for difficulty urinating, dysuria and frequency.   Musculoskeletal:  Negative for arthralgias.  Skin:  Negative for itching and rash.  Neurological:  Negative for light-headedness and numbness.  Hematological:  Negative for adenopathy. Does not bruise/bleed easily.  Psychiatric/Behavioral:  Negative for confusion.    PHYSICAL EXAMINATION:  Vitals:   06/07/24 1025  BP: 132/72  Pulse: 70  Resp: 18  Temp: 98.6 F (37 C)  SpO2: 96%   Filed Weights   06/07/24 1025  Weight: 221 lb 9.6 oz (100.5 kg)    Physical Exam Constitutional:      General: He is not in acute distress. HENT:     Head: Normocephalic and atraumatic.  Eyes:     General: No scleral icterus. Cardiovascular:      Rate and Rhythm: Normal rate and regular rhythm.  Pulmonary:     Effort: Pulmonary effort is normal. No respiratory distress.     Breath sounds: Normal breath sounds. No wheezing.  Abdominal:     General: Bowel sounds are normal. There is no distension.     Palpations: Abdomen is soft.  Musculoskeletal:        General: No deformity. Normal range of motion.     Cervical back: Normal range of motion and neck supple.  Skin:    General: Skin is dry.     Findings: No erythema or rash.  Neurological:     Mental Status: He is alert and oriented to person, place, and time. Mental status is at baseline.  Psychiatric:        Mood and Affect: Mood normal.     LABORATORY DATA:  I have reviewed the data as listed    Latest Ref Rng & Units 05/31/2024   10:55 AM 02/11/2024    5:00 AM 02/10/2024    2:08 AM  CBC  WBC 4.0 - 10.5 K/uL 7.3  9.2  14.0   Hemoglobin 13.0 - 17.0 g/dL 85.6  88.5  88.1   Hematocrit 39.0 - 52.0 % 44.2  34.7  36.9   Platelets 150 - 400 K/uL 223  183  158       Latest Ref Rng & Units 05/31/2024   10:55 AM 02/11/2024    5:00 AM 02/10/2024    4:35 AM  CMP  Glucose 70 - 99 mg/dL 95  886  876   BUN 8 - 23 mg/dL 24  23  27   Creatinine 0.61 - 1.24 mg/dL 8.72  8.85  8.80   Sodium 135 - 145 mmol/L 140  138  138   Potassium 3.5 - 5.1 mmol/L 4.0  4.3  4.1   Chloride 98 - 111 mmol/L 103  106  109   CO2 22 - 32 mmol/L 27  24  22    Calcium  8.9 - 10.3 mg/dL 9.4  8.1  8.4   Total Protein 6.5 - 8.1 g/dL 6.9     Total Bilirubin 0.0 - 1.2 mg/dL 0.5     Alkaline Phos 38 - 126 U/L 53     AST 15 - 41 U/L 15     ALT 0 - 44 U/L 12         RADIOGRAPHIC STUDIES: I have personally reviewed the radiological images as listed and agreed with the findings in the report. No results found.

## 2024-06-07 NOTE — Assessment & Plan Note (Signed)
 6mm RLL nodule, repeat CT in 6 months

## 2024-06-07 NOTE — Assessment & Plan Note (Addendum)
 unprovoked.  Continue anticoagulation Eliquis  5mg  BID for 6 months [till end of Feb/early March]. I plan to decrease to lower prophylactic dosage at next visit.   Hypercoagulable work up  Negative cadiolipin abs, beta 2 glycoprotein abs, no lupus anticoagulant,  negative prothrombin gene and factor 5 leiden mutation. Normal protein S Ag and activity, normal protein C Ag, decreased protein C activity 71% decreased antithrombin III   05/31/2024  repeat protein C activity normal, and antithrombin III  level  normal.

## 2024-08-23 ENCOUNTER — Other Ambulatory Visit

## 2024-08-30 ENCOUNTER — Inpatient Hospital Stay: Admitting: Oncology

## 2024-08-30 ENCOUNTER — Inpatient Hospital Stay
